# Patient Record
Sex: Female | Born: 1970 | Race: Black or African American | Hispanic: No | Marital: Single | Smoking: Current every day smoker
Health system: Southern US, Community
[De-identification: ages and names within clinical notes are randomized; demographics above are authoritative.]

## PROBLEM LIST (undated history)

## (undated) DIAGNOSIS — M329 Systemic lupus erythematosus, unspecified: Secondary | ICD-10-CM

## (undated) HISTORY — PX: TUBAL LIGATION: SHX77

---

## 1999-10-04 ENCOUNTER — Encounter: Payer: Self-pay | Admitting: *Deleted

## 1999-10-04 ENCOUNTER — Inpatient Hospital Stay (HOSPITAL_COMMUNITY): Admission: AD | Admit: 1999-10-04 | Discharge: 1999-10-04 | Payer: Self-pay | Admitting: *Deleted

## 2000-01-03 ENCOUNTER — Encounter: Payer: Self-pay | Admitting: Obstetrics

## 2000-01-03 ENCOUNTER — Encounter (INDEPENDENT_AMBULATORY_CARE_PROVIDER_SITE_OTHER): Payer: Self-pay | Admitting: Specialist

## 2000-01-03 ENCOUNTER — Inpatient Hospital Stay (HOSPITAL_COMMUNITY): Admission: AD | Admit: 2000-01-03 | Discharge: 2000-01-06 | Payer: Self-pay | Admitting: *Deleted

## 2008-11-21 ENCOUNTER — Emergency Department (HOSPITAL_COMMUNITY): Admission: EM | Admit: 2008-11-21 | Discharge: 2008-11-21 | Payer: Self-pay | Admitting: Emergency Medicine

## 2009-09-08 ENCOUNTER — Emergency Department (HOSPITAL_COMMUNITY): Admission: EM | Admit: 2009-09-08 | Discharge: 2009-09-08 | Payer: Self-pay | Admitting: Emergency Medicine

## 2010-06-29 ENCOUNTER — Emergency Department (HOSPITAL_COMMUNITY): Admission: EM | Admit: 2010-06-29 | Discharge: 2010-06-29 | Payer: Self-pay | Admitting: Emergency Medicine

## 2010-10-15 ENCOUNTER — Emergency Department (HOSPITAL_COMMUNITY)
Admission: EM | Admit: 2010-10-15 | Discharge: 2010-10-16 | Disposition: A | Discharge status: Home / Self Care | Payer: Medicaid Other | Attending: Emergency Medicine | Admitting: Emergency Medicine

## 2010-10-15 DIAGNOSIS — R05 Cough: Secondary | ICD-10-CM | POA: Insufficient documentation

## 2010-10-15 DIAGNOSIS — R071 Chest pain on breathing: Secondary | ICD-10-CM | POA: Insufficient documentation

## 2010-10-15 DIAGNOSIS — R059 Cough, unspecified: Secondary | ICD-10-CM | POA: Insufficient documentation

## 2010-10-16 ENCOUNTER — Emergency Department (HOSPITAL_COMMUNITY): Payer: Medicaid Other

## 2010-10-16 ENCOUNTER — Ambulatory Visit (HOSPITAL_COMMUNITY): Admission: RE | Admit: 2010-10-16 | Payer: Medicaid Other | Source: Ambulatory Visit

## 2010-10-18 LAB — DIFFERENTIAL
Basophils Absolute: 0 10*3/uL (ref 0.0–0.1)
Basophils Relative: 0 % (ref 0–1)
Eosinophils Relative: 1 % (ref 0–5)
Monocytes Absolute: 0.4 10*3/uL (ref 0.1–1.0)

## 2010-10-18 LAB — CBC
HCT: 35.4 % — ABNORMAL LOW (ref 36.0–46.0)
MCH: 31.2 pg (ref 26.0–34.0)
MCHC: 32.8 g/dL (ref 30.0–36.0)
MCV: 95.2 fL (ref 78.0–100.0)
RDW: 11.9 % (ref 11.5–15.5)

## 2010-10-18 LAB — POCT I-STAT, CHEM 8
Calcium, Ion: 1.16 mmol/L (ref 1.12–1.32)
HCT: 39 % (ref 36.0–46.0)
TCO2: 26 mmol/L (ref 0–100)

## 2010-11-14 ENCOUNTER — Inpatient Hospital Stay (INDEPENDENT_AMBULATORY_CARE_PROVIDER_SITE_OTHER)
Admission: RE | Admit: 2010-11-14 | Discharge: 2010-11-14 | Disposition: A | Discharge status: Home / Self Care | Payer: Medicaid Other | Source: Ambulatory Visit | Attending: Family Medicine | Admitting: Family Medicine

## 2010-11-14 DIAGNOSIS — J069 Acute upper respiratory infection, unspecified: Secondary | ICD-10-CM

## 2010-11-14 DIAGNOSIS — B9789 Other viral agents as the cause of diseases classified elsewhere: Secondary | ICD-10-CM

## 2010-12-23 NOTE — Op Note (Signed)
[facility]  Patient:    Stephanie Fitzgerald, Stephanie Fitzgerald                     MRN: [id number] Proc. Date: 01/05/00 Adm. Date:  [id number] Disc. Date: [id number] Attending:  Tammi Sou Dictator:   Andrey Spearman, M.D.                           Operative Report  PREOPERATIVE DIAGNOSIS:  POSTOPERATIVE DIAGNOSIS:  OPERATION:                    Bilateral tubal ligation.  SURGEON:                      Conni Elliot, M.D.  ASSISTANT:                    Andrey Spearman, M.D.  ANESTHESIA:  ESTIMATED BLOOD LOSS:  DESCRIPTION OF PROCEDURE:     The patients identity was confirmed and consent verified.  The patient underwent spinal anesthesia.  The patient was prepped and draped in the usual sterile fashion.  Periumbilical incision was made and fascial planes were identified.  The fascia was entered using Metzenbaum scissors.  The  patient was placed in Trendelenburg.  Right fallopian tube was identified and grasped.  Fallopian tube was followed through to the fimbria.  The fallopian tube grasped.  The fallopian tube was tied with suture securely x 2.  Approximately .5 cm segment of tube was removed.  The tube was monitored for good hemostasis which was present and released back into the peritoneum with continued good hemostasis. The same procedure was followed on the left.  The peritoneum was closed.  The fascia was closed.  Subcutaneous and cutaneous suturing was done.  The patient ad good cosmetic result at the end.  Estimated blood loss was less than 10 cc. There were no complications. DD:  01/05/00 TD:  01/09/00 Job: [id number]

## 2011-04-20 ENCOUNTER — Other Ambulatory Visit: Payer: Self-pay | Admitting: Internal Medicine

## 2011-04-20 DIAGNOSIS — Z1231 Encounter for screening mammogram for malignant neoplasm of breast: Secondary | ICD-10-CM

## 2011-04-25 ENCOUNTER — Ambulatory Visit: Payer: Medicaid Other

## 2011-12-23 ENCOUNTER — Emergency Department (INDEPENDENT_AMBULATORY_CARE_PROVIDER_SITE_OTHER)
Admission: EM | Admit: 2011-12-23 | Discharge: 2011-12-23 | Disposition: A | Discharge status: Home / Self Care | Payer: Medicaid Other | Source: Home / Self Care | Attending: Emergency Medicine | Admitting: Emergency Medicine

## 2011-12-23 ENCOUNTER — Encounter (HOSPITAL_COMMUNITY): Payer: Self-pay | Admitting: Emergency Medicine

## 2011-12-23 DIAGNOSIS — M79609 Pain in unspecified limb: Secondary | ICD-10-CM

## 2011-12-23 DIAGNOSIS — T6391XA Toxic effect of contact with unspecified venomous animal, accidental (unintentional), initial encounter: Secondary | ICD-10-CM

## 2011-12-23 DIAGNOSIS — M79646 Pain in unspecified finger(s): Secondary | ICD-10-CM

## 2011-12-23 DIAGNOSIS — T63481A Toxic effect of venom of other arthropod, accidental (unintentional), initial encounter: Secondary | ICD-10-CM

## 2011-12-23 MED ORDER — PREDNISONE 20 MG PO TABS
40.0000 mg | ORAL_TABLET | Freq: Once | ORAL | Status: AC
  Administered 2011-12-23: 40 mg via ORAL

## 2011-12-23 MED ORDER — DIPHENHYDRAMINE HCL 25 MG PO TABS: 25.0000 mg | ORAL_TABLET | Freq: Three times a day (TID) | ORAL | Status: DC | PRN

## 2011-12-23 MED ORDER — IBUPROFEN 600 MG PO TABS: 600.0000 mg | ORAL_TABLET | Freq: Four times a day (QID) | ORAL | Status: AC | PRN

## 2011-12-23 MED ORDER — PREDNISONE 20 MG PO TABS: 40.0000 mg | ORAL_TABLET | Freq: Every day | ORAL | Status: AC

## 2011-12-23 MED ORDER — IBUPROFEN 800 MG PO TABS
ORAL_TABLET | ORAL | Status: AC
  Filled 2011-12-23: qty 1

## 2011-12-23 MED ORDER — IBUPROFEN 800 MG PO TABS
800.0000 mg | ORAL_TABLET | Freq: Once | ORAL | Status: AC
  Administered 2011-12-23: 800 mg via ORAL

## 2011-12-23 MED ORDER — PREDNISONE 20 MG PO TABS
ORAL_TABLET | ORAL | Status: AC
  Filled 2011-12-23: qty 2

## 2011-12-23 NOTE — ED Notes (Signed)
Reports sting in left ring finger.  Patient did not see anything, but felt sharp pain and swelling to left ring finger, itching hand.

## 2011-12-23 NOTE — ED Provider Notes (Signed)
History     CSN: [id number]  Arrival date & time 12/23/11  1840   First MD Initiated Contact with Patient 12/23/11 1843      Chief Complaint  Patient presents with  . Insect Bite    (Consider location/radiation/quality/duration/timing/severity/associated sxs/prior treatment) HPI Comments: Patient reports that less than an hour ago she was completely well in told she while opening her car door she felt something stung her on her left ring finger she started experiencing immediate burning and measure on her finger and shooting pains down her left finger. In the next few minutes her finger started to become swollen and tender and at this point is having problems moving her finger. She immediately removed some rings she had on her finger. Patient denies any other symptoms such as sore throat, generalized rashes, facial swelling or tongue swelling or respiratory problems. She is feeling a constant throbbing burning type pain.  "It's was trying to see if I stinger", but did not see anything and started pouring alcohol on my finger.  Patient is a 41 y.o. female presenting with hand pain. The history is provided by the patient.  Hand Pain This is a new problem. The current episode started less than 1 hour ago. The problem occurs constantly. The problem has been gradually worsening. Exacerbated by: MOVEMENT AND TOUCH. The symptoms are relieved by nothing.    History reviewed. No pertinent past medical history.  History reviewed. No pertinent past surgical history.  No family history on file.  History  Substance Use Topics  . Smoking status: Current Everyday Smoker  . Smokeless tobacco: Not on file  . Alcohol Use: No    OB History    Grav Para Term Preterm Abortions TAB SAB Ect Mult Living                  Review of Systems  Constitutional: Negative for fever, chills, activity change and appetite change.  Skin: Negative for color change and wound.  Neurological: Negative for  dizziness, weakness and numbness.    Allergies  Review of patient's allergies indicates no known allergies.  Home Medications  No current outpatient prescriptions on file.  BP 112/52  Pulse 62  Temp(Src) 98.8 F (37.1 C) (Oral)  Resp 12  SpO2 100%  LMP 12/16/2011  Physical Exam  Nursing note and vitals reviewed. Constitutional: She appears well-developed and well-nourished.  Musculoskeletal: She exhibits edema and tenderness.       Left hand: She exhibits tenderness and swelling. She exhibits normal two-point discrimination and no laceration. normal sensation noted.       Hands: Skin: No rash noted. No erythema.    ED Course  Procedures (including critical care time)  Labs Reviewed - No data to display No results found.   No diagnosis found.    MDM   Patient was sudden abrupt sensation of a insect bite or sting, with subsequent localized inflammation or reaction to her left ring finger. Patient without any other systemic symptoms. Have encouraged her to keep her hand elevated apply ice and start her on antihistamines and a short course of prednisone. Patient was advised as well as to persist or worsen she should go to the emergency department. This point suspect this is a localized reaction I was unable to visualize any openings in her epidermis and did not observe signs of infection. This clinical manifestation appears suddenly in the course of less then 55 minutes before presentation to urgent care.  Jimmie Molly, MD 12/23/11 1901

## 2011-12-23 NOTE — Discharge Instructions (Signed)
Keep your hand elevated as discussed in today apply ice packs for 10-15 minutes at a time to prevent further swelling keep your hand elevated as much as possible the next 24 hours. Take Benadryl every 8 hours and continue with prednisone at home for 4 days. If swelling worsens, or any fevers or chills no improvement 24 hours should followup with the emergency department

## 2012-04-14 ENCOUNTER — Emergency Department (HOSPITAL_COMMUNITY)
Admission: EM | Admit: 2012-04-14 | Discharge: 2012-04-14 | Disposition: A | Discharge status: Home / Self Care | Payer: Medicaid Other | Source: Home / Self Care

## 2012-04-14 ENCOUNTER — Emergency Department (INDEPENDENT_AMBULATORY_CARE_PROVIDER_SITE_OTHER): Payer: Medicaid Other

## 2012-04-14 ENCOUNTER — Encounter (HOSPITAL_COMMUNITY): Payer: Self-pay

## 2012-04-14 DIAGNOSIS — S93409A Sprain of unspecified ligament of unspecified ankle, initial encounter: Secondary | ICD-10-CM

## 2012-04-14 DIAGNOSIS — S96919A Strain of unspecified muscle and tendon at ankle and foot level, unspecified foot, initial encounter: Secondary | ICD-10-CM

## 2012-04-14 DIAGNOSIS — S93609A Unspecified sprain of unspecified foot, initial encounter: Secondary | ICD-10-CM

## 2012-04-14 NOTE — ED Notes (Signed)
Pt twisted lt ankle on Friday and continues to have pain in lt ankle and foot that radiates up outer aspect of leg to her knee. Pt is able to ambulate.

## 2012-04-17 NOTE — ED Provider Notes (Signed)
History     CSN: [id number]  Arrival date & time 04/14/12  1426   None     Chief Complaint  Patient presents with  . Ankle Pain    (Consider location/radiation/quality/duration/timing/severity/associated sxs/prior treatment) HPI Comments: Twisted ankle and c/o of local pain. Yesterday.    History reviewed. No pertinent past medical history.  History reviewed. No pertinent past surgical history.  History reviewed. No pertinent family history.  History  Substance Use Topics  . Smoking status: Current Every Day Smoker -- 1.0 packs/day  . Smokeless tobacco: Not on file  . Alcohol Use: No    OB History    Grav Para Term Preterm Abortions TAB SAB Ect Mult Living                  Review of Systems  Constitutional: Negative.   Respiratory: Negative.   Musculoskeletal: Positive for joint swelling and gait problem.  Neurological: Negative.     Allergies  Review of patient's allergies indicates no known allergies.  Home Medications   Current Outpatient Rx  Name Route Sig Dispense Refill  . IBUPROFEN 200 MG PO TABS Oral Take 200 mg by mouth every 6 (six) hours as needed.    Marland Kitchen DIPHENHYDRAMINE HCL 25 MG PO TABS Oral Take 1 tablet (25 mg total) by mouth every 8 (eight) hours as needed for itching. 20 tablet 0    BP 115/77  Pulse 76  Temp 98.5 F (36.9 C) (Oral)  Resp 18  SpO2 97%  LMP 04/07/2012  Physical Exam  Constitutional: She is oriented to person, place, and time. She appears well-developed and well-nourished. No distress.  Neck: Normal range of motion. Neck supple.  Musculoskeletal:       Ankle pain, tenderness and mild swelling, tenderness about the entire ankle. No deformity.   Neurological: She is alert and oriented to person, place, and time. No cranial nerve deficit.    ED Course  Procedures (including critical care time)  Labs Reviewed - No data to display No results found.   1. Ankle sprain and strain   2. Foot sprain       MDM   ASO RICE OTC meds prn pain.  No results found. No results found.       Hayden Rasmussen, NP 04/17/12 [phone]

## 2012-04-19 NOTE — ED Provider Notes (Signed)
Medical screening examination/treatment/procedure(s) were performed by resident physician or non-physician practitioner and as supervising physician I was immediately available for consultation/collaboration.   Fumie Fiallo DOUGLAS MD.    Adeoluwa Silvers D Jaylen Claude, MD 04/19/12 1331 

## 2012-06-16 ENCOUNTER — Encounter (HOSPITAL_COMMUNITY): Payer: Self-pay | Admitting: Emergency Medicine

## 2012-06-16 ENCOUNTER — Emergency Department (HOSPITAL_COMMUNITY)
Admission: EM | Admit: 2012-06-16 | Discharge: 2012-06-16 | Disposition: A | Discharge status: Home / Self Care | Payer: Medicaid Other | Attending: Emergency Medicine | Admitting: Emergency Medicine

## 2012-06-16 DIAGNOSIS — F17200 Nicotine dependence, unspecified, uncomplicated: Secondary | ICD-10-CM | POA: Insufficient documentation

## 2012-06-16 DIAGNOSIS — L719 Rosacea, unspecified: Secondary | ICD-10-CM | POA: Insufficient documentation

## 2012-06-16 MED ORDER — METRONIDAZOLE 1 % EX GEL: Freq: Every day | CUTANEOUS | Status: DC

## 2012-06-16 MED ORDER — DOXYCYCLINE HYCLATE 100 MG PO CAPS: 100.0000 mg | ORAL_CAPSULE | Freq: Two times a day (BID) | ORAL | Status: DC

## 2012-06-16 NOTE — ED Provider Notes (Addendum)
History  This chart was scribed for Stephanie Sprout, MD by Stephanie Fitzgerald. This patient was seen in room TR10C/TR10C and the patient's care was started at 12:24.   CSN: [id number]  Arrival date & time 06/16/12  1201   First MD Initiated Contact with Patient 06/16/12 1224      Chief Complaint  Patient presents with  . Rash    (Consider location/radiation/quality/duration/timing/severity/associated sxs/prior treatment) The history is provided by the patient. No language interpreter was used.  Stephanie Fitzgerald is a 41 y.o. female who presents to the Emergency Department complaining of an itching facial rash for the past 5 months, worse upon waking this morning, that feels like someone is sticking needles in her. Pt denies any drainage from the area. Pt reports a mosquito bit her on the bridge of her nose this summer and she has had the rash ever since. Pt reports initially it was concentrated to one area,a round the bite, then she started putting ointment on it and it spread. Pt has been using ointment until 2-3 weeks ago, then it started getting worse. Pt is not allergic to any antibiotics. Pt has no PCP but she does have insurance.  History reviewed. No pertinent past medical history.  History reviewed. No pertinent past surgical history.  No family history on file.  History  Substance Use Topics  . Smoking status: Current Every Day Smoker -- 1.0 packs/day  . Smokeless tobacco: Not on file  . Alcohol Use: Yes    OB History    Grav Para Term Preterm Abortions TAB SAB Ect Mult Living                  Review of Systems  Constitutional: Negative for fever and chills.  HENT: Negative for neck pain.   Eyes: Negative for visual disturbance.  Respiratory: Negative for shortness of breath.   Cardiovascular: Negative for leg swelling.  Gastrointestinal: Negative for nausea and vomiting.  Musculoskeletal: Negative for back pain.  Skin: Positive for rash.  Neurological: Negative for  weakness.    Allergies  Review of patient's allergies indicates no known allergies.  Home Medications  No current outpatient prescriptions on file.  Triage Vitals: Pulse 53  Temp 98.3 F (36.8 C) (Oral)  Resp 22  Ht 5\' 8"  (1.727 m)  Wt 180 lb (81.647 kg)  BMI 27.37 kg/m2  Physical Exam  Nursing note and vitals reviewed. Constitutional: She is oriented to person, place, and time. She appears well-developed and well-nourished. No distress.  HENT:  Head: Normocephalic and atraumatic.    Eyes: EOM are normal. Pupils are equal, round, and reactive to light.  Neck: Neck supple. No tracheal deviation present.  Cardiovascular: Normal rate.   Pulmonary/Chest: Effort normal. No respiratory distress.  Abdominal: Soft. She exhibits no distension.  Musculoskeletal: Normal range of motion. She exhibits no edema.  Neurological: She is alert and oriented to person, place, and time.  Skin: Skin is warm and dry.  Psychiatric: She has a normal mood and affect.    ED Course  Procedures (including critical care time) DIAGNOSTIC STUDIES:  COORDINATION OF CARE: 12:45--I evaluated the patient and we discussed a treatment plan including antibiotics and incision and drainage to which the pt agreed.   13:14--I performed the incision and drainage procedure.  INCISION AND DRAINAGE Performed by: Stephanie Fitzgerald Consent: Verbal consent obtained. Risks and benefits: risks, benefits and alternatives were discussed Type: abscess  Body area: face  Anesthesia: local infiltration  Local anesthetic: lidocaine 1% with  epinephrine  Anesthetic total: 1 ml  Complexity: simple area was poked with a needle  Drainage: none  Drainage amount: 0  Patient tolerance: Patient tolerated the procedure well with no immediate complications.    Site anesthetized, incision made over site, wound drained and explored loculations, rinsed with copious amounts of normal saline, wound packed with sterile  gauze, covered with dry, sterile dressing.  Pt tolerated procedure well without complications.  Instructions for care discussed verbally and pt provided with additional written instructions for homecare and f/u.  I gave the pt instructions to follow up with a dermatologist.   Labs Reviewed - No data to display No results found.   1. Rosacea       MDM   Patient with a rash on her face that has been ongoing for several months. She states it worsened over the last 3 weeks with redness and swelling. Concerning for rosacea versus abscess. Area was anesthetized and a needle used with almost no pus drainage. Discussed with patient that feels is most likely rosacea. Will start patient on an antibiotic and referral to dermatology.     I personally performed the services described in this documentation, which was scribed in my presence.  The recorded information has been reviewed and considered.    Stephanie Sprout, MD 06/16/12 1325  Stephanie Sprout, MD 06/16/12 1329

## 2012-06-16 NOTE — ED Notes (Signed)
Pt. Reports that something bit her on the rt. Bridge of her nose this summer and she has developed a rash.

## 2012-09-27 ENCOUNTER — Encounter (HOSPITAL_COMMUNITY): Payer: Self-pay | Admitting: Emergency Medicine

## 2012-09-27 ENCOUNTER — Emergency Department (HOSPITAL_COMMUNITY)
Admission: EM | Admit: 2012-09-27 | Discharge: 2012-09-27 | Disposition: A | Discharge status: Home / Self Care | Payer: Medicaid Other | Source: Home / Self Care | Attending: Emergency Medicine | Admitting: Emergency Medicine

## 2012-09-27 DIAGNOSIS — R21 Rash and other nonspecific skin eruption: Secondary | ICD-10-CM

## 2012-09-27 LAB — CBC
HCT: 36.2 % (ref 36.0–46.0)
MCH: 31.5 pg (ref 26.0–34.0)
MCHC: 33.4 g/dL (ref 30.0–36.0)
RDW: 12.2 % (ref 11.5–15.5)

## 2012-09-27 LAB — COMPREHENSIVE METABOLIC PANEL
ALT: 14 U/L (ref 0–35)
Calcium: 9.8 mg/dL (ref 8.4–10.5)
Creatinine, Ser: 0.7 mg/dL (ref 0.50–1.10)
GFR calc Af Amer: >90 mL/min (ref >90)
Glucose, Bld: 80 mg/dL (ref 70–99)
Sodium: 138 mEq/L (ref 135–145)
Total Protein: 8.1 g/dL (ref 6.0–8.3)

## 2012-09-27 MED ORDER — TRIAMCINOLONE ACETONIDE 0.1 % EX CREA: TOPICAL_CREAM | Freq: Two times a day (BID) | CUTANEOUS | Status: AC

## 2012-09-27 MED ORDER — DOXYCYCLINE HYCLATE 100 MG PO CAPS: 100.0000 mg | ORAL_CAPSULE | Freq: Two times a day (BID) | ORAL | Status: AC

## 2012-09-27 NOTE — ED Notes (Signed)
Pt c/o rash on face x2 days Sx include: swelling, itching, pain Denies: f/v/n/d, new hygiene products, foods, SOB Hast tried OTC ointment First time this has happened to her  She is alert w/no signs of acute distress

## 2012-09-27 NOTE — ED Provider Notes (Signed)
History     CSN: [id number]  Arrival date & time 09/27/12  1030   First MD Initiated Contact with Patient 09/27/12 1038      Chief Complaint  Patient presents with  . Rash    (Consider location/radiation/quality/duration/timing/severity/associated sxs/prior treatment) HPI Comments: Patient presents urgent care describing that for the last week or so she has developed this worsening rash on her face. It started on the right side of her cheek and now has moved to oversew left side and top of her nose. The rash is tender it does itch some and now its swollen. She describes it in the month of November she had a similar rash and was treated in the emergency department with antibiotics which she described rash improved afterwards. She however describes it she always had a bit of a rash in the same area that it never cleared up completely. She suspects it last week and she was exposed to cold weather, the rash returned and got much worse.  On further questioning patient denies any systemic symptoms such as arthralgias, weight loss, fevers, appetite changes, or oral mucosal lesions.  Patient is a 42 y.o. female presenting with rash. The history is provided by the patient.  Rash Location:  Face Quality: painful, redness, scaling and swelling   Quality: not bruising, not burning, not dry and not weeping   Pain details:    Quality:  Aching and pressure   Onset quality:  Gradual   Duration:  1 day Severity:  Moderate Onset quality:  Gradual Context: not chemical exposure, not exposure to similar rash, not hot tub use, not insect bite/sting, not plant contact and not sun exposure   Relieved by: Other locations have been treated with antibiotics patient reports improvement. Worsened by:  Nothing tried Associated symptoms: no diarrhea, no fatigue, no fever, no headaches, no joint pain, no myalgias, no nausea, no periorbital edema, no shortness of breath and not vomiting     History reviewed. No  pertinent past medical history.  Past Surgical History  Procedure Laterality Date  . Tubal ligation      History reviewed. No pertinent family history.  History  Substance Use Topics  . Smoking status: Current Every Day Smoker -- 0.50 packs/day    Types: Cigarettes  . Smokeless tobacco: Not on file  . Alcohol Use: Yes    OB History   Grav Para Term Preterm Abortions TAB SAB Ect Mult Living                  Review of Systems  Constitutional: Negative for fever, chills, diaphoresis, activity change, appetite change and fatigue.  HENT: Negative for neck pain.   Respiratory: Negative for shortness of breath.   Gastrointestinal: Negative for nausea, vomiting and diarrhea.  Musculoskeletal: Negative for myalgias, back pain, joint swelling, arthralgias and gait problem.  Skin: Positive for color change and rash.  Neurological: Negative for weakness and headaches.    Allergies  Review of patient's allergies indicates no known allergies.  Home Medications   Current Outpatient Rx  Name  Route  Sig  Dispense  Refill  . doxycycline (VIBRAMYCIN) 100 MG capsule   Oral   Take 1 capsule (100 mg total) by mouth 2 (two) times daily.   28 capsule   0   . doxycycline (VIBRAMYCIN) 100 MG capsule   Oral   Take 1 capsule (100 mg total) by mouth 2 (two) times daily.   20 capsule   0   .  metroNIDAZOLE (METROGEL) 1 % gel   Topical   Apply topically daily. Apply to affected area on the face   45 g   0   . triamcinolone cream (KENALOG) 0.1 %   Topical   Apply topically 2 (two) times daily. Apply bid x 2 weeks   30 g   0     BP 126/86  Pulse 57  Temp(Src) 98.4 F (36.9 C) (Oral)  Resp 18  SpO2 100%  LMP 09/07/2012  Physical Exam  Nursing note and vitals reviewed. Constitutional: She is oriented to person, place, and time. Vital signs are normal. She appears well-developed and well-nourished.  HENT:  Head: Normocephalic.    Eyes: Conjunctivae are normal. Pupils are  equal, round, and reactive to light. Right eye exhibits no discharge. Left eye exhibits no discharge. No scleral icterus.  Neck: Neck supple. No JVD present.  Cardiovascular: Normal rate.   No murmur heard. Pulmonary/Chest: Effort normal and breath sounds normal.  Lymphadenopathy:    She has no cervical adenopathy.  Neurological: She is alert and oriented to person, place, and time.  Skin: Rash noted. There is erythema.    ED Course  Procedures (including critical care time)  Labs Reviewed  ANA  CBC  SEDIMENTATION RATE  COMPREHENSIVE METABOLIC PANEL   No results found.   1. Facial rash     Patient Stephanie Fitzgerald consented- for picture for medical documentation purposes.  MDM   Problem #1 recurrent facial " butterfly pattern"- will pursue further evaluation, suspicious as well for a temperature/ environmental induce rash as patient reports started/exacerbated with the cold weather.  Review of systems, somewhat unremarkable as patient denied any arthralgia or systemic symptoms. Does describe having had this rash on several occasions although smaller. Today we have ordered a screening for autoimmune disorders suspecting perhaps a dermatological manifestation of lupus. Patient had been prescribed empirical antibiotic for 7 days and she describes that she has responded to this treatment in the past and also a topical medium potency steroid cream.   Patient has a primary care Dr. will follow for medical as an active Medicaid card- have instructed patient to call today his primary care doctor's office to establish a followup visit in 2-3 weeks. Patient agrees understands and will proceed to schedule a followup visit with his primary care doctor. She has been advised she will be contacted if any of her test results come back abnormal     Jimmie Molly, MD 09/27/12 1221

## 2012-09-30 LAB — ANA: Anti Nuclear Antibody(ANA): NEGATIVE

## 2013-05-14 ENCOUNTER — Other Ambulatory Visit: Payer: Self-pay

## 2013-05-14 DIAGNOSIS — Z1231 Encounter for screening mammogram for malignant neoplasm of breast: Secondary | ICD-10-CM

## 2013-06-05 ENCOUNTER — Ambulatory Visit: Payer: Medicaid Other

## 2013-06-24 ENCOUNTER — Ambulatory Visit: Payer: Self-pay | Admitting: Advanced Practice Midwife

## 2013-12-01 ENCOUNTER — Other Ambulatory Visit: Payer: Self-pay

## 2013-12-01 DIAGNOSIS — Z1231 Encounter for screening mammogram for malignant neoplasm of breast: Secondary | ICD-10-CM

## 2013-12-02 ENCOUNTER — Ambulatory Visit
Admission: RE | Admit: 2013-12-02 | Discharge: 2013-12-02 | Disposition: A | Discharge status: Home / Self Care | Payer: Medicaid Other | Source: Ambulatory Visit

## 2013-12-02 ENCOUNTER — Encounter (INDEPENDENT_AMBULATORY_CARE_PROVIDER_SITE_OTHER): Payer: Self-pay

## 2013-12-02 DIAGNOSIS — Z1231 Encounter for screening mammogram for malignant neoplasm of breast: Secondary | ICD-10-CM

## 2014-09-11 ENCOUNTER — Emergency Department (HOSPITAL_COMMUNITY)
Admission: EM | Admit: 2014-09-11 | Discharge: 2014-09-11 | Disposition: A | Discharge status: Home / Self Care | Payer: Medicaid Other | Source: Home / Self Care | Attending: Family Medicine | Admitting: Family Medicine

## 2014-09-11 ENCOUNTER — Encounter (HOSPITAL_COMMUNITY): Payer: Self-pay | Admitting: Emergency Medicine

## 2014-09-11 DIAGNOSIS — M5489 Other dorsalgia: Secondary | ICD-10-CM

## 2014-09-11 LAB — POCT URINALYSIS DIP (DEVICE)
Bilirubin Urine: NEGATIVE
GLUCOSE, UA: NEGATIVE mg/dL
Hgb urine dipstick: NEGATIVE
Ketones, ur: NEGATIVE mg/dL
Leukocytes, UA: NEGATIVE
NITRITE: NEGATIVE
PROTEIN: NEGATIVE mg/dL
Specific Gravity, Urine: 1.01 (ref 1.005–1.030)
Urobilinogen, UA: 0.2 mg/dL (ref 0.0–1.0)
pH: 7 (ref 5.0–8.0)

## 2014-09-11 LAB — POCT PREGNANCY, URINE: Preg Test, Ur: NEGATIVE

## 2014-09-11 MED ORDER — CYCLOBENZAPRINE HCL 10 MG PO TABS: 10.0000 mg | ORAL_TABLET | Freq: Two times a day (BID) | ORAL | Status: DC | PRN

## 2014-09-11 MED ORDER — DICLOFENAC SODIUM 75 MG PO TBEC: 75.0000 mg | DELAYED_RELEASE_TABLET | Freq: Two times a day (BID) | ORAL | Status: DC

## 2014-09-11 NOTE — ED Notes (Signed)
C/o back pain onset last night; denies inj/trauma  But works as a Glass blower/designerpacker in [employer] and feels like she pulled a muscle  Denies urinary sx  Alert, no signs of acute distress.

## 2014-09-11 NOTE — ED Provider Notes (Signed)
CSN: [id number]     Arrival date & time 09/11/14  1436 History   First MD Initiated Contact with Patient 09/11/14 1515     Chief Complaint  Patient presents with  . Back Pain   (Consider location/radiation/quality/duration/timing/severity/associated sxs/prior Treatment) HPI  44 year old female presents for evaluation of back pain. This started yesterday. She has pain over her entire back in the muscles. No midline pain. No extremity numbness or weakness. No loss of bowel or bladder control. She took ibuprofen last night which helped.  History reviewed. No pertinent past medical history. Past Surgical History  Procedure Laterality Date  . Tubal ligation     No family history on file. History  Substance Use Topics  . Smoking status: Current Every Day Smoker -- 0.50 packs/day    Types: Cigarettes  . Smokeless tobacco: Not on file  . Alcohol Use: Yes   OB History    No data available     Review of Systems  Musculoskeletal: Positive for back pain.  All other systems reviewed and are negative.   Allergies  Review of patient's allergies indicates no known allergies.  Home Medications   Prior to Admission medications   Medication Sig Start Date End Date Taking? Authorizing Provider  mycophenolate (CELLCEPT) 500 MG tablet Take 500 mg by mouth 2 (two) times daily.   Yes Historical Provider, MD  predniSONE (DELTASONE) 20 MG tablet Take 20 mg by mouth daily with breakfast.   Yes Historical Provider, MD  cyclobenzaprine (FLEXERIL) 10 MG tablet Take 1 tablet (10 mg total) by mouth 2 (two) times daily as needed for muscle spasms. 09/11/14   Graylon Good, PA-C  diclofenac (VOLTAREN) 75 MG EC tablet Take 1 tablet (75 mg total) by mouth 2 (two) times daily. 09/11/14   Graylon Good, PA-C  doxycycline (VIBRAMYCIN) 100 MG capsule Take 1 capsule (100 mg total) by mouth 2 (two) times daily. 06/16/12   [name], MD  metroNIDAZOLE (METROGEL) 1 % gel Apply topically daily. Apply to  affected area on the face 06/16/12   [name], MD   BP 121/85 mmHg  Pulse 66  Temp(Src) 98.2 F (36.8 C) (Oral)  Resp 16  SpO2 98%  LMP 09/06/2013 Physical Exam  Constitutional: She is oriented to person, place, and time. Vital signs are normal. She appears well-developed and well-nourished. No distress.  HENT:  Head: Normocephalic and atraumatic.  Cardiovascular: Normal rate, regular rhythm and normal heart sounds.   Pulmonary/Chest: Effort normal and breath sounds normal. No respiratory distress.  Abdominal: Soft. Normal appearance. She exhibits no mass. There is no tenderness.  Musculoskeletal:       Thoracic back: Normal.       Lumbar back: Normal.  Neurological: She is alert and oriented to person, place, and time. She has normal strength and normal reflexes. No sensory deficit. Coordination and gait normal.  Skin: Skin is warm and dry. No rash noted. She is not diaphoretic.  Psychiatric: She has a normal mood and affect. Judgment normal.  Nursing note and vitals reviewed.   ED Course  Procedures (including critical care time) Labs Review Labs Reviewed  POCT URINALYSIS DIP (DEVICE)  POCT PREGNANCY, URINE    Imaging Review No results found.   MDM   1. Other back pain    Treat with NSAID and flexeril. Stay active.  F/U PRN    New Prescriptions   CYCLOBENZAPRINE (FLEXERIL) 10 MG TABLET    Take 1 tablet (10 mg total) by mouth 2 (  two) times daily as needed for muscle spasms.   DICLOFENAC (VOLTAREN) 75 MG EC TABLET    Take 1 tablet (75 mg total) by mouth 2 (two) times daily.       Graylon GoodZachary H Delray Reza, PA-C 09/11/14 1537

## 2014-09-11 NOTE — Discharge Instructions (Signed)
Back Pain, Adult °Back pain is very common. The pain often gets better over time. The cause of back pain is usually not dangerous. Most people can learn to manage their back pain on their own.  °HOME CARE  °· Stay active. Start with short walks on flat ground if you can. Try to walk farther each day. °· Do not sit, drive, or stand in one place for more than 30 minutes. Do not stay in bed. °· Do not avoid exercise or work. Activity can help your back heal faster. °· Be careful when you bend or lift an object. Bend at your knees, keep the object close to you, and do not twist. °· Sleep on a firm mattress. Lie on your side, and bend your knees. If you lie on your back, put a pillow under your knees. °· Only take medicines as told by your doctor. °· Put ice on the injured area. °¨ Put ice in a plastic bag. °¨ Place a towel between your skin and the bag. °¨ Leave the ice on for 15-20 minutes, 03-04 times a day for the first 2 to 3 days. After that, you can switch between ice and heat packs. °· Ask your doctor about back exercises or massage. °· Avoid feeling anxious or stressed. Find good ways to deal with stress, such as exercise. °GET HELP RIGHT AWAY IF:  °· Your pain does not go away with rest or medicine. °· Your pain does not go away in 1 week. °· You have new problems. °· You do not feel well. °· The pain spreads into your legs. °· You cannot control when you poop (bowel movement) or pee (urinate). °· Your arms or legs feel weak or lose feeling (numbness). °· You feel sick to your stomach (nauseous) or throw up (vomit). °· You have belly (abdominal) pain. °· You feel like you may pass out (faint). °MAKE SURE YOU:  °· Understand these instructions. °· Will watch your condition. °· Will get help right away if you are not doing well or get worse. °Document Released: 01/10/2008 Document Revised: 10/16/2011 Document Reviewed: 11/25/2013 °[employer]® Patient Information ©2015 [employer]. This information is not intended  to replace advice given to you by your health care provider. Make sure you discuss any questions you have with your health care provider. ° °

## 2014-10-22 ENCOUNTER — Other Ambulatory Visit: Payer: Self-pay

## 2014-10-22 DIAGNOSIS — Z1231 Encounter for screening mammogram for malignant neoplasm of breast: Secondary | ICD-10-CM

## 2014-12-04 ENCOUNTER — Ambulatory Visit: Payer: Medicaid Other

## 2015-11-06 ENCOUNTER — Encounter (HOSPITAL_COMMUNITY): Payer: Self-pay | Admitting: Emergency Medicine

## 2015-11-06 ENCOUNTER — Emergency Department (INDEPENDENT_AMBULATORY_CARE_PROVIDER_SITE_OTHER)
Admission: EM | Admit: 2015-11-06 | Discharge: 2015-11-06 | Disposition: A | Discharge status: Home / Self Care | Payer: Self-pay | Source: Home / Self Care | Attending: Emergency Medicine | Admitting: Emergency Medicine

## 2015-11-06 DIAGNOSIS — K529 Noninfective gastroenteritis and colitis, unspecified: Secondary | ICD-10-CM

## 2015-11-06 MED ORDER — ONDANSETRON HCL 4 MG PO TABS: 4.0000 mg | ORAL_TABLET | Freq: Three times a day (TID) | ORAL | Status: DC | PRN

## 2015-11-06 MED ORDER — ONDANSETRON 4 MG PO TBDP
4.0000 mg | ORAL_TABLET | Freq: Once | ORAL | Status: AC
  Administered 2015-11-06: 4 mg via ORAL

## 2015-11-06 MED ORDER — ONDANSETRON 4 MG PO TBDP
ORAL_TABLET | ORAL | Status: AC
  Filled 2015-11-06: qty 1

## 2015-11-06 NOTE — ED Notes (Signed)
C/o cold sx onset today associated w/cough, chills, v/d and HA A&O x4... No acute distress.

## 2015-11-06 NOTE — Discharge Instructions (Signed)
You have a stomach bug. The worst is typically over in 24-48 hours. Take Zofran every 8 hours as needed for nausea and vomiting. Make sure you are drinking plenty of liquids. Once you start to feel better, slowly reintroduce solid food. The diarrhea should start to improve on Monday, but may take up to a week to fully resolve. Follow-up as needed.

## 2015-11-06 NOTE — ED Provider Notes (Signed)
CSN: [id number]     Arrival date & time 11/06/15  1842 History   First MD Initiated Contact with Patient 11/06/15 2035     Chief Complaint  Patient presents with  . URI   (Consider location/radiation/quality/duration/timing/severity/associated sxs/prior Treatment) HPI  She is a 45 year old woman here for evaluation of vomiting. She states she developed nausea, vomiting, and diarrhea today. This is associated with sweats and chills, but no documented fever. She denies any nasal congestion or rhinorrhea. No sore throat. She does report a minimal cough. She has been able to tolerate small sips of ginger ale, but that's it. She denies any abdominal pain.  History reviewed. No pertinent past medical history. Past Surgical History  Procedure Laterality Date  . Tubal ligation     No family history on file. Social History  Substance Use Topics  . Smoking status: Current Every Day Smoker -- 0.50 packs/day    Types: Cigarettes  . Smokeless tobacco: None  . Alcohol Use: Yes   OB History    No data available     Review of Systems As in history of present illness Allergies  Review of patient's allergies indicates no known allergies.  Home Medications   Prior to Admission medications   Medication Sig Start Date End Date Taking? Authorizing Provider  predniSONE (DELTASONE) 20 MG tablet Take 20 mg by mouth daily with breakfast.   Yes Historical Provider, MD  cyclobenzaprine (FLEXERIL) 10 MG tablet Take 1 tablet (10 mg total) by mouth 2 (two) times daily as needed for muscle spasms. 09/11/14   Graylon GoodZachary H Baker, PA-C  diclofenac (VOLTAREN) 75 MG EC tablet Take 1 tablet (75 mg total) by mouth 2 (two) times daily. 09/11/14   Graylon GoodZachary H Baker, PA-C  mycophenolate (CELLCEPT) 500 MG tablet Take 500 mg by mouth 2 (two) times daily.    Historical Provider, MD  ondansetron (ZOFRAN) 4 MG tablet Take 1 tablet (4 mg total) by mouth every 8 (eight) hours as needed for nausea or vomiting. 11/06/15   Charm RingsErin J Honig,  MD   Meds Ordered and Administered this Visit   Medications  ondansetron (ZOFRAN-ODT) disintegrating tablet 4 mg (4 mg Oral Given 11/06/15 2107)    BP 135/92 mmHg  Pulse 66  Temp(Src) 99 F (37.2 C) (Oral)  Resp 20  SpO2 99%  LMP 11/06/2015 No data found.   Physical Exam  Constitutional: She is oriented to person, place, and time. She appears well-nourished. No distress.  Neck: Neck supple.  Cardiovascular: Normal rate, regular rhythm and normal heart sounds.   No murmur heard. Pulmonary/Chest: Effort normal and breath sounds normal. No respiratory distress. She has no wheezes. She has no rales.  Abdominal: Soft. Bowel sounds are normal. She exhibits no distension. There is no tenderness. There is no rebound and no guarding.  Neurological: She is alert and oriented to person, place, and time.    ED Course  Procedures (including critical care time)  Labs Review Labs Reviewed - No data to display  Imaging Review No results found.    MDM   1. Gastroenteritis    Nausea is improved after Zofran.  Symptomatic treatment with Zofran for nausea and vomiting. Discussed expected time course. Follow-up as needed.    Charm RingsErin J Honig, MD 11/06/15 2129

## 2016-07-19 ENCOUNTER — Emergency Department (HOSPITAL_COMMUNITY)
Admission: EM | Admit: 2016-07-19 | Discharge: 2016-07-19 | Disposition: A | Discharge status: Home / Self Care | Payer: Medicaid Other | Attending: Emergency Medicine | Admitting: Emergency Medicine

## 2016-07-19 ENCOUNTER — Encounter (HOSPITAL_COMMUNITY): Payer: Self-pay | Admitting: *Deleted

## 2016-07-19 DIAGNOSIS — F17210 Nicotine dependence, cigarettes, uncomplicated: Secondary | ICD-10-CM | POA: Insufficient documentation

## 2016-07-19 DIAGNOSIS — M546 Pain in thoracic spine: Secondary | ICD-10-CM | POA: Diagnosis present

## 2016-07-19 DIAGNOSIS — X501XXA Overexertion from prolonged static or awkward postures, initial encounter: Secondary | ICD-10-CM | POA: Insufficient documentation

## 2016-07-19 DIAGNOSIS — Z79899 Other long term (current) drug therapy: Secondary | ICD-10-CM | POA: Insufficient documentation

## 2016-07-19 DIAGNOSIS — Y939 Activity, unspecified: Secondary | ICD-10-CM | POA: Insufficient documentation

## 2016-07-19 DIAGNOSIS — Y929 Unspecified place or not applicable: Secondary | ICD-10-CM | POA: Diagnosis not present

## 2016-07-19 DIAGNOSIS — M62830 Muscle spasm of back: Secondary | ICD-10-CM | POA: Insufficient documentation

## 2016-07-19 DIAGNOSIS — Y999 Unspecified external cause status: Secondary | ICD-10-CM | POA: Diagnosis not present

## 2016-07-19 MED ORDER — CYCLOBENZAPRINE HCL 10 MG PO TABS: 10.0000 mg | ORAL_TABLET | Freq: Two times a day (BID) | ORAL | 0 refills | Status: DC | PRN

## 2016-07-19 MED ORDER — ACETAMINOPHEN 500 MG PO TABS
1000.0000 mg | ORAL_TABLET | Freq: Once | ORAL | Status: AC
  Administered 2016-07-19: 1000 mg via ORAL
  Filled 2016-07-19: qty 2

## 2016-07-19 MED ORDER — CYCLOBENZAPRINE HCL 10 MG PO TABS
10.0000 mg | ORAL_TABLET | Freq: Once | ORAL | Status: AC
  Administered 2016-07-19: 10 mg via ORAL
  Filled 2016-07-19: qty 1

## 2016-07-19 MED ORDER — IBUPROFEN 800 MG PO TABS
800.0000 mg | ORAL_TABLET | Freq: Three times a day (TID) | ORAL | 0 refills | Status: DC
Start: 2016-07-19 — End: 2019-12-08

## 2016-07-19 NOTE — ED Triage Notes (Addendum)
Pt reports onset this am of sharp pain to left upper back while getting something out of a drawer. Pain radiates down her back.

## 2016-07-19 NOTE — ED Notes (Signed)
Pt is in stable condition upon d/c and ambulates from ED. 

## 2016-07-19 NOTE — Discharge Instructions (Signed)
Medications: Flexeril, ibuprofen  Treatment: Take Flexeril twice daily as needed for muscle pain and spasms. Do not drive or operate machinery when taking this medication. Take ibuprofen every 8 hours as needed for your pain. Use heating pad 3-4 times daily alternating 20 minutes on, 20 minutes off. Attempt the stretches that we discussed as tolerated.  Follow-up: Please return to emergency department if you develop any new or worsening symptoms and follow-up with your primary care provider if your symptoms are not improving over the next week.

## 2016-07-19 NOTE — ED Provider Notes (Signed)
[facility]-EMERGENCY DEPT Provider Note   CSN: [id number] Arrival date & time: 07/19/16  1032  By signing my name below, I, Placido Sou, attest that this documentation has been prepared under the direction and in the presence of Buel Ream, PA-C.  Electronically Signed: Placido Sou, ED Scribe. 07/19/16. 11:13 AM.   History   Chief Complaint Chief Complaint  Patient presents with  . Back Pain   HPI HPI Comments: Stephanie Fitzgerald is a 45 y.o. female who presents to the Emergency Department complaining of sudden onset, moderate, left upper back pain which began PTA. She states she was pulling something out of a drawer and felt a sudden onset of her pain. Her pain worsens with movement, especially movement of her LUE. She took 1x aleve w/o significant relief. She denies a h/o CA,  IVDA or kidney issues. Pt denies numbness, tingling, fevers, chills, leg pain, CP, SOB, abdominal pain, saddle anesthesia, bowel or bladder incontinence, nausea, vomiting, urinary symptoms or other associated symptoms at this time.   The history is provided by the patient and medical records. No language interpreter was used.    History reviewed. No pertinent past medical history.  There are no active problems to display for this patient.   Past Surgical History:  Procedure Laterality Date  . TUBAL LIGATION      OB History    No data available       Home Medications    Prior to Admission medications   Medication Sig Start Date End Date Taking? Authorizing Provider  cyclobenzaprine (FLEXERIL) 10 MG tablet Take 1 tablet (10 mg total) by mouth 2 (two) times daily as needed for muscle spasms. 07/19/16   Emi Holes, PA-C  diclofenac (VOLTAREN) 75 MG EC tablet Take 1 tablet (75 mg total) by mouth 2 (two) times daily. 09/11/14   Graylon Good, PA-C  ibuprofen (ADVIL,MOTRIN) 800 MG tablet Take 1 tablet (800 mg total) by mouth 3 (three) times daily. 07/19/16   Emi Holes, PA-C  mycophenolate  (CELLCEPT) 500 MG tablet Take 500 mg by mouth 2 (two) times daily.    Historical Provider, MD  ondansetron (ZOFRAN) 4 MG tablet Take 1 tablet (4 mg total) by mouth every 8 (eight) hours as needed for nausea or vomiting. 11/06/15   Charm Rings, MD  predniSONE (DELTASONE) 20 MG tablet Take 20 mg by mouth daily with breakfast.    Historical Provider, MD    Family History History reviewed. No pertinent family history.  Social History Social History  Substance Use Topics  . Smoking status: Current Every Day Smoker    Packs/day: 0.50    Types: Cigarettes  . Smokeless tobacco: Not on file  . Alcohol use Yes     Allergies   Patient has no known allergies.   Review of Systems Review of Systems  Constitutional: Negative for chills and fever.  HENT: Negative for facial swelling and sore throat.   Respiratory: Negative for shortness of breath.   Cardiovascular: Negative for chest pain.  Gastrointestinal: Negative for abdominal pain, nausea and vomiting.  Genitourinary: Negative for dysuria.  Musculoskeletal: Positive for back pain and myalgias. Negative for neck pain and neck stiffness.  Skin: Negative for rash and wound.  Neurological: Negative for headaches.  Psychiatric/Behavioral: The patient is not nervous/anxious.     Physical Exam Updated Vital Signs BP 125/90 (BP Location: Right Arm)   Pulse 62   Temp 97.9 F (36.6 C) (Oral)   Resp 18  LMP 07/15/2016   SpO2 100%    Physical Exam  Constitutional: She appears well-developed and well-nourished. No distress.  HENT:  Head: Normocephalic and atraumatic.  Mouth/Throat: Oropharynx is clear and moist. No oropharyngeal exudate.  Eyes: Conjunctivae are normal. Pupils are equal, round, and reactive to light. Right eye exhibits no discharge. Left eye exhibits no discharge. No scleral icterus.  Neck: Normal range of motion. Neck supple. No thyromegaly present.  Cardiovascular: Normal rate, regular rhythm and normal heart sounds.   Exam reveals no gallop and no friction rub.   No murmur heard. Pulmonary/Chest: Effort normal and breath sounds normal. No stridor. No respiratory distress. She has no wheezes. She has no rales.  Abdominal: Soft. Bowel sounds are normal. She exhibits no distension. There is no tenderness. There is no rebound and no guarding.  Musculoskeletal: She exhibits tenderness. She exhibits no edema.  TTP and spasm just inferior to the left shoulder blade. TTP to the left thoracic and lumbar paraspinal musculature. No midline spinous tenderness.   Lymphadenopathy:    She has no cervical adenopathy.  Neurological: She is alert. Coordination normal.  Skin: Skin is warm and dry. No rash noted. She is not diaphoretic. No pallor.  Psychiatric: She has a normal mood and affect.  Nursing note and vitals reviewed.   ED Treatments / Results  Labs (all labs ordered are listed, but only abnormal results are displayed) Labs Reviewed - No data to display  EKG  EKG Interpretation None       Radiology No results found.  Procedures Procedures  DIAGNOSTIC STUDIES: Oxygen Saturation is 100% on RA, normal by my interpretation.    COORDINATION OF CARE: 11:11 AM Discussed next steps with pt. Pt verbalized understanding and is agreeable with the plan.    Medications Ordered in ED Medications  cyclobenzaprine (FLEXERIL) tablet 10 mg (10 mg Oral Given 07/19/16 1118)  acetaminophen (TYLENOL) tablet 1,000 mg (1,000 mg Oral Given 07/19/16 1119)     Initial Impression / Assessment and Plan / ED Course  I have reviewed the triage vital signs and the nursing notes.  Pertinent labs & imaging results that were available during my care of the patient were reviewed by me and considered in my medical decision making (see chart for details).  Clinical Course     Patient with L thoracic back pain.  No neurological deficits and normal neuro exam.  Patient is ambulatory.  No loss of bowel or bladder control.  No  concern for cauda equina.  No fever, night sweats, weight loss, h/o cancer, IVDA, no recent procedure to back. No urinary symptoms suggestive of UTI. Patient given Flexeril and Tylenol in the ED with good relief. Discharge home with Flexeril and ibuprofen. Heat and ice therapy discussed, as well as stretches. Supportive care and return precaution discussed. Patient understands and agrees with plan. Appears safe for discharge at this time.   I personally performed the services described in this documentation, which was scribed in my presence. The recorded information has been reviewed and is accurate.  Final Clinical Impressions(s) / ED Diagnoses   Final diagnoses:  Muscle spasm of back    New Prescriptions Discharge Medication List as of 07/19/2016 11:40 AM    START taking these medications   Details  ibuprofen (ADVIL,MOTRIN) 800 MG tablet Take 1 tablet (800 mg total) by mouth 3 (three) times daily., Starting Wed 07/19/2016, Print         Emi Holeslexandra M Clayson Riling, PA-C 07/19/16 1246  Benjiman CoreNathan Pickering, MD 07/19/16 [phone]

## 2017-03-05 ENCOUNTER — Ambulatory Visit (HOSPITAL_COMMUNITY)
Admission: EM | Admit: 2017-03-05 | Discharge: 2017-03-05 | Disposition: A | Discharge status: Home / Self Care | Payer: Medicaid Other | Attending: Family Medicine | Admitting: Family Medicine

## 2017-03-05 ENCOUNTER — Encounter (HOSPITAL_COMMUNITY): Payer: Self-pay | Admitting: *Deleted

## 2017-03-05 DIAGNOSIS — L03113 Cellulitis of right upper limb: Secondary | ICD-10-CM

## 2017-03-05 DIAGNOSIS — S60561A Insect bite (nonvenomous) of right hand, initial encounter: Secondary | ICD-10-CM | POA: Diagnosis not present

## 2017-03-05 DIAGNOSIS — W57XXXA Bitten or stung by nonvenomous insect and other nonvenomous arthropods, initial encounter: Secondary | ICD-10-CM | POA: Diagnosis not present

## 2017-03-05 MED ORDER — PREDNISONE 10 MG (21) PO TBPK: ORAL_TABLET | ORAL | 0 refills | Status: DC

## 2017-03-05 MED ORDER — FAMOTIDINE 20 MG PO TABS
20.0000 mg | ORAL_TABLET | Freq: Once | ORAL | Status: AC
  Administered 2017-03-05: 20 mg via ORAL

## 2017-03-05 MED ORDER — DOXYCYCLINE HYCLATE 100 MG PO TABS
100.0000 mg | ORAL_TABLET | Freq: Once | ORAL | Status: AC
  Administered 2017-03-05: 100 mg via ORAL

## 2017-03-05 MED ORDER — METHYLPREDNISOLONE ACETATE 80 MG/ML IJ SUSP
80.0000 mg | Freq: Once | INTRAMUSCULAR | Status: AC
  Administered 2017-03-05: 80 mg via INTRAMUSCULAR

## 2017-03-05 MED ORDER — DOXYCYCLINE HYCLATE 100 MG PO TABS
ORAL_TABLET | ORAL | Status: AC
  Filled 2017-03-05: qty 1

## 2017-03-05 MED ORDER — DIPHENHYDRAMINE HCL 25 MG PO CAPS
ORAL_CAPSULE | ORAL | Status: AC
  Filled 2017-03-05: qty 2

## 2017-03-05 MED ORDER — METHYLPREDNISOLONE ACETATE 80 MG/ML IJ SUSP
INTRAMUSCULAR | Status: AC
  Filled 2017-03-05: qty 1

## 2017-03-05 MED ORDER — DOXYCYCLINE HYCLATE 100 MG PO CAPS: 100.0000 mg | ORAL_CAPSULE | Freq: Two times a day (BID) | ORAL | 0 refills | Status: DC

## 2017-03-05 MED ORDER — FAMOTIDINE 20 MG PO TABS
ORAL_TABLET | ORAL | Status: AC
  Filled 2017-03-05: qty 1

## 2017-03-05 MED ORDER — DIPHENHYDRAMINE HCL 25 MG PO CAPS
50.0000 mg | ORAL_CAPSULE | Freq: Once | ORAL | Status: AC
  Administered 2017-03-05: 50 mg via ORAL

## 2017-03-05 NOTE — ED Triage Notes (Signed)
Patient states on Friday she sustained an insect bite to the right posterior aspect of hand. States is very itchy. On exam area is red and swollen. No signs drainage noted to area of bite.

## 2017-03-05 NOTE — ED Provider Notes (Signed)
CSN: [id number]     Arrival date & time 03/05/17  1359 History   None    Chief Complaint  Patient presents with  . Insect Bite   (Consider location/radiation/quality/duration/timing/severity/associated sxs/prior Treatment) Stephanie Fitzgerald is a 46 y.o. female who [facility] with a chief complaint of swelling, redness, itchiness, turn right hand. States 2 nights ago she was bitten by some type of insect, she did not see what it was. She has applied rubbing alcohol to the area, was given her no relief. Has had no over-the-counter Benadryl, or any anti-itch cream. Denies any fever, chills, or other constitutional symptoms. She is right handed, has had some drainage from the area that was bitten.       History reviewed. No pertinent past medical history. Past Surgical History:  Procedure Laterality Date  . TUBAL LIGATION     History reviewed. No pertinent family history. Social History  Substance Use Topics  . Smoking status: Current Every Day Smoker    Packs/day: 0.50    Types: Cigarettes  . Smokeless tobacco: Never Used  . Alcohol use Yes   OB History    No data available     Review of Systems  Skin: Positive for color change and wound.  All other systems reviewed and are negative.   Allergies  Patient has no known allergies.  Home Medications   Prior to Admission medications   Medication Sig Start Date End Date Taking? Authorizing Provider  cyclobenzaprine (FLEXERIL) 10 MG tablet Take 1 tablet (10 mg total) by mouth 2 (two) times daily as needed for muscle spasms. 07/19/16   Law, Waylan BogaAlexandra M, PA-C  diclofenac (VOLTAREN) 75 MG EC tablet Take 1 tablet (75 mg total) by mouth 2 (two) times daily. 09/11/14   Graylon GoodBaker, Zachary H, PA-C  doxycycline (VIBRAMYCIN) 100 MG capsule Take 1 capsule (100 mg total) by mouth 2 (two) times daily. 03/05/17   Dorena BodoKennard, Sharrell Krawiec, NP  ibuprofen (ADVIL,MOTRIN) 800 MG tablet Take 1 tablet (800 mg total) by mouth 3 (three)  times daily. 07/19/16   Law, Waylan BogaAlexandra M, PA-C  mycophenolate (CELLCEPT) 500 MG tablet Take 500 mg by mouth 2 (two) times daily.    [provider]  ondansetron (ZOFRAN) 4 MG tablet Take 1 tablet (4 mg total) by mouth every 8 (eight) hours as needed for nausea or vomiting. 11/06/15   Charm RingsHonig, Erin J, MD  predniSONE (STERAPRED UNI-PAK 21 TAB) 10 MG (21) TBPK tablet Take 6 tablets tomorrow, decrease by 1 each day till finished (9,1,4,7,8,2(6,5,4,3,2,1) 03/05/17   Dorena BodoKennard, Roni Friberg, NP   Meds Ordered and Administered this Visit   Medications  doxycycline (VIBRA-TABS) tablet 100 mg (100 mg Oral Given 03/05/17 1513)  famotidine (PEPCID) tablet 20 mg (20 mg Oral Given 03/05/17 1514)  diphenhydrAMINE (BENADRYL) capsule 50 mg (50 mg Oral Given 03/05/17 1514)  methylPREDNISolone acetate (DEPO-MEDROL) injection 80 mg (80 mg Intramuscular Given 03/05/17 1515)    BP 112/78 (BP Location: Left Arm)   Pulse 78   Temp 98 F (36.7 C) (Oral)   Resp 17   SpO2 100%  No data found.   Physical Exam  Constitutional: She is oriented to person, place, and time. She appears well-developed and well-nourished. No distress.  HENT:  Head: Normocephalic and atraumatic.  Right Ear: External ear normal.  Left Ear: External ear normal.  Eyes: Conjunctivae are normal.  Neck: Normal range of motion.  Cardiovascular: Normal rate and regular rhythm.   Pulmonary/Chest: Effort  normal and breath sounds normal.  Neurological: She is alert and oriented to person, place, and time.  Skin: Skin is warm and dry. Capillary refill takes less than 2 seconds. Rash noted. She is not diaphoretic. There is erythema.  Area of erythema on the lateral surface of the right hand, with a small lesion with purulent drainage. No red streaking observed. Full range of motion of the digits, sensory function intact, capillary refill less than 2 seconds.  Psychiatric: She has a normal mood and affect. Her behavior is normal.  Nursing note and vitals  reviewed.   Urgent Care Course     Procedures (including critical care time)  Labs Review Labs Reviewed - No data to display  Imaging Review No results found.     MDM   1. Insect bite, initial encounter   2. Cellulitis of right upper extremity     Given Depo-Medrol, Benadryl, Pepcid, doxycycline in clinic, continue over-the-counter Benadryl and Pepcid, started on doxycycline for cellulitis, and Medrol Dosepak. If red streaking occurs, go to the ER.    Dorena BodoKennard, Jonique Kulig, NP 03/05/17 1704

## 2017-03-05 NOTE — Discharge Instructions (Signed)
For your insect bite., Take over-the-counter Benadryl every 6 hours, not to exceed 300 mg a day. May also take over-the-counter Pepcid twice daily as well. For infection, started on doxycycline 1 tablet twice a day, and a Medrol Dosepak take starting tomorrow, as you have received an injection here. If the redness continues, or if you  see red streaking up your arm, go to the ER

## 2019-05-06 ENCOUNTER — Encounter (HOSPITAL_COMMUNITY): Payer: Self-pay

## 2019-05-06 ENCOUNTER — Other Ambulatory Visit: Payer: Self-pay

## 2019-05-06 ENCOUNTER — Ambulatory Visit (HOSPITAL_COMMUNITY)
Admission: EM | Admit: 2019-05-06 | Discharge: 2019-05-06 | Disposition: A | Discharge status: Home / Self Care | Payer: Self-pay | Attending: Family Medicine | Admitting: Family Medicine

## 2019-05-06 DIAGNOSIS — T63421A Toxic effect of venom of ants, accidental (unintentional), initial encounter: Secondary | ICD-10-CM

## 2019-05-06 HISTORY — DX: Systemic lupus erythematosus, unspecified: M32.9

## 2019-05-06 MED ORDER — METHYLPREDNISOLONE ACETATE 80 MG/ML IJ SUSP
INTRAMUSCULAR | Status: AC
  Filled 2019-05-06: qty 1

## 2019-05-06 MED ORDER — METHYLPREDNISOLONE ACETATE 80 MG/ML IJ SUSP
80.0000 mg | Freq: Once | INTRAMUSCULAR | Status: AC
  Administered 2019-05-06: 09:00:00 80 mg via INTRAMUSCULAR

## 2019-05-06 NOTE — ED Triage Notes (Signed)
Pt states she thinks she stepped in a ant hill This started Sunday. (Left ankle swelling )

## 2019-05-06 NOTE — Discharge Instructions (Addendum)
Elevate foot as much as you are able Limit walking Return if worse instead of better at any time

## 2019-05-06 NOTE — ED Provider Notes (Signed)
[facility]    CSN: [id number] Arrival date & time: 05/06/19  0818      History   Chief Complaint Chief Complaint  Patient presents with  . Insect Bite    HPI Stephanie Fitzgerald is a 48 y.o. female.   HPI  Patient states that she was taking a walk on Saturday.  Felt stinging in her left ankle.  She states that she is brushed off some insects, it was getting dark, she did not see what it was.  The next morning she noticed that she had small "pimples" all around her ankle.  It was stinging and bothering her somewhat.  She soaked her ankle in E[place] salt yesterday.  This caused it to swell.  She is here for swelling and discomfort in the left ankle.  It is not severely painful at rest.  She does have limited movement.  It hurts with walking. Patient has lupus that is currently not active  Past Medical History:  Diagnosis Date  . Lupus (HCC)     There are no active problems to display for this patient.   Past Surgical History:  Procedure Laterality Date  . TUBAL LIGATION      OB History   No obstetric history on file.      Home Medications    Prior to Admission medications   Medication Sig Start Date End Date Taking? Authorizing Provider  ibuprofen (ADVIL,MOTRIN) 800 MG tablet Take 1 tablet (800 mg total) by mouth 3 (three) times daily. 07/19/16   Law, Waylan Boga, PA-C  mycophenolate (CELLCEPT) 500 MG tablet Take 500 mg by mouth 2 (two) times daily.    [provider]    Family History History reviewed. No pertinent family history.  Social History Social History   Tobacco Use  . Smoking status: Current Every Day Smoker    Packs/day: 0.50    Types: Cigarettes  . Smokeless tobacco: Never Used  Substance Use Topics  . Alcohol use: Yes  . Drug use: No     Allergies   Patient has no known allergies.   Review of Systems Review of Systems  Constitutional: Negative for chills and fever.  HENT: Negative for ear pain and sore throat.    Eyes: Negative for pain and visual disturbance.  Respiratory: Negative for cough and shortness of breath.   Cardiovascular: Positive for leg swelling. Negative for chest pain and palpitations.  Gastrointestinal: Negative for abdominal pain and vomiting.  Genitourinary: Negative for dysuria and hematuria.  Musculoskeletal: Positive for gait problem. Negative for arthralgias and back pain.  Skin: Positive for color change and wound. Negative for rash.  Neurological: Negative for seizures and syncope.  All other systems reviewed and are negative.    Physical Exam Triage Vital Signs ED Triage Vitals  Enc Vitals Group     BP 05/06/19 0839 (!) 135/102     Pulse Rate 05/06/19 0839 75     Resp 05/06/19 0839 18     Temp 05/06/19 0839 98.3 F (36.8 C)     Temp Source 05/06/19 0839 Oral     SpO2 05/06/19 0839 100 %     Weight 05/06/19 0837 190 lb (86.2 kg)     Height --      Head Circumference --      Peak Flow --      Pain Score 05/06/19 0836 8     Pain Loc --      Pain Edu? --  Excl. in GC? --    No data found.  Updated Vital Signs BP (!) 135/102 (BP Location: Right Arm)   Pulse 75   Temp 98.3 F (36.8 C) (Oral)   Resp 18   Wt 86.2 kg   SpO2 100%   BMI 28.89 kg/m   Visual Acuity Right Eye Distance:   Left Eye Distance:   Bilateral Distance:    Right Eye Near:   Left Eye Near:    Bilateral Near:     Physical Exam Constitutional:      General: She is not in acute distress.    Appearance: She is well-developed.  HENT:     Head: Normocephalic and atraumatic.  Eyes:     Conjunctiva/sclera: Conjunctivae normal.     Pupils: Pupils are equal, round, and reactive to light.  Neck:     Musculoskeletal: Normal range of motion.  Cardiovascular:     Rate and Rhythm: Normal rate.  Pulmonary:     Effort: Pulmonary effort is normal. No respiratory distress.  Abdominal:     General: There is no distension.     Palpations: Abdomen is soft.  Musculoskeletal: Normal  range of motion.  Skin:    General: Skin is warm and dry.     Comments: Left ankle has multiple healing bites, approximately 8.  Soft tissue swelling from the malleoli down to the toes.  Nontender.  Slight warmth.  Slight erythema.  Neurological:     Mental Status: She is alert.  Psychiatric:        Mood and Affect: Mood normal.        Behavior: Behavior normal.      UC Treatments / Results  Labs (all labs ordered are listed, but only abnormal results are displayed) Labs Reviewed - No data to display  EKG   Radiology No results found.  Procedures Procedures (including critical care time)  Medications Ordered in UC Medications  methylPREDNISolone acetate (DEPO-MEDROL) injection 80 mg (has no administration in time range)    Initial Impression / Assessment and Plan / UC Course  I have reviewed the triage vital signs and the nursing notes.  Pertinent labs & imaging results that were available during my care of the patient were reviewed by me and considered in my medical decision making (see chart for details).     I told the patient that these are fire ants.  She should try cool compresses instead of heat.  This should resolve over time.  I think that this is more of an inflammatory reaction than infection.  We will treat her with steroids. Final Clinical Impressions(s) / UC Diagnoses   Final diagnoses:  Fire ant bite, accidental or unintentional, initial encounter     Discharge Instructions     Elevate foot as much as you are able Limit walking Return if worse instead of better at any time   ED Prescriptions    None     PDMP not reviewed this encounter.   Raylene Everts, MD 05/06/19 0900

## 2019-11-20 ENCOUNTER — Emergency Department (HOSPITAL_COMMUNITY)
Admission: EM | Admit: 2019-11-20 | Discharge: 2019-11-20 | Disposition: A | Discharge status: Home / Self Care | Payer: Self-pay | Attending: Emergency Medicine | Admitting: Emergency Medicine

## 2019-11-20 ENCOUNTER — Emergency Department (HOSPITAL_COMMUNITY): Payer: Self-pay

## 2019-11-20 ENCOUNTER — Encounter (HOSPITAL_COMMUNITY): Payer: Self-pay

## 2019-11-20 ENCOUNTER — Other Ambulatory Visit: Payer: Self-pay

## 2019-11-20 DIAGNOSIS — M545 Low back pain: Secondary | ICD-10-CM | POA: Insufficient documentation

## 2019-11-20 DIAGNOSIS — M3210 Systemic lupus erythematosus, organ or system involvement unspecified: Secondary | ICD-10-CM | POA: Insufficient documentation

## 2019-11-20 DIAGNOSIS — R52 Pain, unspecified: Secondary | ICD-10-CM

## 2019-11-20 DIAGNOSIS — R102 Pelvic and perineal pain: Secondary | ICD-10-CM

## 2019-11-20 DIAGNOSIS — F17210 Nicotine dependence, cigarettes, uncomplicated: Secondary | ICD-10-CM | POA: Insufficient documentation

## 2019-11-20 DIAGNOSIS — I10 Essential (primary) hypertension: Secondary | ICD-10-CM | POA: Insufficient documentation

## 2019-11-20 DIAGNOSIS — A5901 Trichomonal vulvovaginitis: Secondary | ICD-10-CM | POA: Insufficient documentation

## 2019-11-20 LAB — LIPASE, BLOOD: Lipase: 26 U/L (ref 11–51)

## 2019-11-20 LAB — CBC
HCT: 38.9 % (ref 36.0–46.0)
Hemoglobin: 12.2 g/dL (ref 12.0–15.0)
MCH: 30.6 pg (ref 26.0–34.0)
MCHC: 31.4 g/dL (ref 30.0–36.0)
MCV: 97.5 fL (ref 80.0–100.0)
Platelets: 261 10*3/uL (ref 150–400)
RBC: 3.99 MIL/uL (ref 3.87–5.11)
RDW: 11.9 % (ref 11.5–15.5)
WBC: 4.7 10*3/uL (ref 4.0–10.5)
nRBC: 0 % (ref 0.0–0.2)

## 2019-11-20 LAB — WET PREP, GENITAL
Clue Cells Wet Prep HPF POC: NONE SEEN
Sperm: NONE SEEN
Yeast Wet Prep HPF POC: NONE SEEN

## 2019-11-20 LAB — COMPREHENSIVE METABOLIC PANEL
ALT: 21 U/L (ref 0–44)
AST: 21 U/L (ref 15–41)
Albumin: 4.1 g/dL (ref 3.5–5.0)
Alkaline Phosphatase: 111 U/L (ref 38–126)
Anion gap: 10 (ref 5–15)
BUN: 9 mg/dL (ref 6–20)
CO2: 23 mmol/L (ref 22–32)
Calcium: 9.3 mg/dL (ref 8.9–10.3)
Chloride: 108 mmol/L (ref 98–111)
Creatinine, Ser: 0.87 mg/dL (ref 0.44–1.00)
GFR calc Af Amer: >60 mL/min (ref >60)
GFR calc non Af Amer: >60 mL/min (ref >60)
Glucose, Bld: 92 mg/dL (ref 70–99)
Potassium: 4.1 mmol/L (ref 3.5–5.1)
Sodium: 141 mmol/L (ref 135–145)
Total Bilirubin: 0.5 mg/dL (ref 0.3–1.2)
Total Protein: 7.5 g/dL (ref 6.5–8.1)

## 2019-11-20 LAB — URINALYSIS, ROUTINE W REFLEX MICROSCOPIC
Bilirubin Urine: NEGATIVE
Glucose, UA: NEGATIVE mg/dL
Ketones, ur: NEGATIVE mg/dL
Nitrite: NEGATIVE
Protein, ur: NEGATIVE mg/dL
Specific Gravity, Urine: 1.01 (ref 1.005–1.030)
pH: 5 (ref 5.0–8.0)

## 2019-11-20 LAB — I-STAT BETA HCG BLOOD, ED (MC, WL, AP ONLY): I-stat hCG, quantitative: <5 m[IU]/mL (ref <5)

## 2019-11-20 LAB — HIV ANTIBODY (ROUTINE TESTING W REFLEX): HIV Screen 4th Generation wRfx: NONREACTIVE

## 2019-11-20 MED ORDER — MORPHINE SULFATE (PF) 4 MG/ML IV SOLN
4.0000 mg | Freq: Once | INTRAVENOUS | Status: AC
  Administered 2019-11-20: 4 mg via INTRAVENOUS
  Filled 2019-11-20: qty 1

## 2019-11-20 MED ORDER — METRONIDAZOLE 500 MG PO TABS
2000.0000 mg | ORAL_TABLET | Freq: Once | ORAL | Status: AC
  Administered 2019-11-20: 2000 mg via ORAL
  Filled 2019-11-20: qty 4

## 2019-11-20 MED ORDER — SODIUM CHLORIDE 0.9 % IV SOLN
1.0000 g | Freq: Once | INTRAVENOUS | Status: AC
  Administered 2019-11-20: 13:00:00 1 g via INTRAVENOUS
  Filled 2019-11-20: qty 10

## 2019-11-20 MED ORDER — AZITHROMYCIN 1 G PO PACK
1.0000 g | PACK | Freq: Once | ORAL | Status: AC
  Administered 2019-11-20: 1 g via ORAL
  Filled 2019-11-20: qty 1

## 2019-11-20 MED ORDER — ONDANSETRON HCL 4 MG/2ML IJ SOLN
4.0000 mg | Freq: Once | INTRAMUSCULAR | Status: AC
  Administered 2019-11-20: 13:00:00 4 mg via INTRAVENOUS
  Filled 2019-11-20: qty 2

## 2019-11-20 NOTE — ED Triage Notes (Signed)
Pt reports lower back pain that radiates down to her pelvis. Denies n/v, no vaginal bleeding or discharge. Pt does report having a harder time with bowel movements but did have one this morning. Pt a.o

## 2019-11-20 NOTE — Discharge Instructions (Signed)
You are seen today for pelvic pain.  It appears that you have a sexually transmitted infection called trichomonas.  We use our still waiting on the results of your gonorrhea, chlamydia and HIV testing.  You were treated for trichomoniasis, gonorrhea and chlamydia today.  Please make sure you let your sexual partner know of your diagnosis so that they may be tested and treated as well.  Refrain from sexual activity until both of you have completed treatment.  Please follow-up with OB/GYN

## 2019-11-20 NOTE — ED Provider Notes (Signed)
[facility] Provider Note   CSN: [id number] Arrival date & time: 11/20/19  0815     History Chief Complaint  Patient presents with  . Back Pain  . Pelvic Pain    Stephanie Fitzgerald is a 49 y.o. female.  Patient is a 49 year old female with past medical history of lupus and hypertension who presents emergency department for pelvic pain.  Patient reports worsening pelvic pain over the last 4 days.  She reports history of tubal ligation in the past.  She reports her last period was about 6 months ago.  She does not see OB/GYN.  She reports she feels a sharp pain in her vaginal area.  She is sexually active but denies any vaginal bleeding or vaginal discharge or dysuria.  The pain radiates into her bilateral lower back.  Denies any fever, chills, nausea, vomiting.  She is eating and drinking normally.  She reports thinking that she has some sort of pelvic problem because that is the area of pain and it feels like when she last gave birth to 20 years ago.        Past Medical History:  Diagnosis Date  . Lupus ([place])     There are no problems to display for this patient.   Past Surgical History:  Procedure Laterality Date  . TUBAL LIGATION       OB History   No obstetric history on file.     No family history on file.  Social History   Tobacco Use  . Smoking status: Current Every Day Smoker    Packs/day: 0.50    Types: Cigarettes  . Smokeless tobacco: Never Used  Substance Use Topics  . Alcohol use: Yes  . Drug use: No    Home Medications Prior to Admission medications   Medication Sig Start Date End Date Taking? Authorizing Provider  ibuprofen (ADVIL,MOTRIN) 800 MG tablet Take 1 tablet (800 mg total) by mouth 3 (three) times daily. 07/19/16  Yes Law, Bea Graff, PA-C  triamcinolone ointment (KENALOG) 0.1 % Apply 1 application topically 2 (two) times daily as needed.  08/03/14  Yes [provider]    Allergies      Patient has no known allergies.  Review of Systems   Review of Systems  Constitutional: Negative for chills and fever.  HENT: Negative for congestion and sore throat.   Respiratory: Negative for cough and shortness of breath.   Cardiovascular: Negative for chest pain.  Gastrointestinal: Positive for abdominal pain. Negative for nausea and vomiting.  Endocrine: Negative for polyuria.  Genitourinary: Positive for pelvic pain and vaginal pain. Negative for decreased urine volume, difficulty urinating, dysuria, flank pain, frequency, genital sores, hematuria, urgency, vaginal bleeding and vaginal discharge.  Musculoskeletal: Positive for back pain.  Skin: Negative for rash and wound.  Neurological: Negative for dizziness.  Hematological: Does not bruise/bleed easily.    Physical Exam Updated Vital Signs BP (!) 136/107   Pulse (!) 54   Temp 98.7 F (37.1 C) (Oral)   Resp 15   Ht 5\' 6"  (1.676 m)   Wt 81.6 kg   LMP 05/22/2019   SpO2 100%   BMI 29.05 kg/m   Physical Exam Vitals and nursing note reviewed. Exam conducted with a chaperone present.  Constitutional:      Appearance: Normal appearance.  HENT:     Head: Normocephalic.     Mouth/Throat:     Mouth: Mucous membranes are moist.  Eyes:  Conjunctiva/sclera: Conjunctivae normal.  Pulmonary:     Effort: Pulmonary effort is normal.     Breath sounds: Normal breath sounds.  Abdominal:     General: Abdomen is flat.     Palpations: Abdomen is soft.     Tenderness: There is no abdominal tenderness.  Genitourinary:    Labia:        Right: No rash, tenderness, lesion or injury.        Left: No rash, tenderness, lesion or injury.      Vagina: No foreign body. Vaginal discharge (Mild, whitish-green) present. No erythema or bleeding.     Cervix: No cervical motion tenderness, discharge, lesion or cervical bleeding.     Uterus: Normal.      Adnexa: Right adnexa normal and left adnexa normal.       Right: No tenderness.          Left: No tenderness.    Skin:    General: Skin is dry.  Neurological:     Mental Status: She is alert.  Psychiatric:        Mood and Affect: Mood normal.     ED Results / Procedures / Treatments   Labs (all labs ordered are listed, but only abnormal results are displayed) Labs Reviewed  WET PREP, GENITAL - Abnormal; Notable for the following components:      Result Value   Trich, Wet Prep PRESENT (*)    WBC, Wet Prep HPF POC MANY (*)    All other components within normal limits  URINALYSIS, ROUTINE W REFLEX MICROSCOPIC - Abnormal; Notable for the following components:   APPearance HAZY (*)    Hgb urine dipstick SMALL (*)    Leukocytes,Ua MODERATE (*)    Bacteria, UA RARE (*)    All other components within normal limits  URINE CULTURE  LIPASE, BLOOD  COMPREHENSIVE METABOLIC PANEL  CBC  HIV ANTIBODY (ROUTINE TESTING W REFLEX)  RPR  I-STAT BETA HCG BLOOD, ED ([facility], [facility], [facility] ONLY)  GC/CHLAMYDIA PROBE AMP ([place]) NOT AT [facility]    EKG None  Radiology US PELVIC COMPLETE W TRANSVAGINAL AND TORSION R/O  Result Date: 11/20/2019 CLINICAL DATA:  Low back pain radiating to pelvis. EXAM: TRANSABDOMINAL AND TRANSVAGINAL ULTRASOUND OF PELVIS TECHNIQUE: Both transabdominal and transvaginal ultrasound examinations of the pelvis were performed. Transabdominal technique was performed for global imaging of the pelvis including uterus, ovaries, adnexal regions, and pelvic cul-de-sac. It was necessary to proceed with endovaginal exam following the transabdominal exam to visualize the ovaries. COMPARISON:  CT 06/29/2010 FINDINGS: Uterus Measurements: 7.9 x 4.2 x 5.2 cm = volume: 90 mL. Small hypodensity identified within the LEFT aspect of the lower uterine segment measuring 1.2 cm Endometrium Thickness: Normal at 4 mm.  No focal abnormality visualized. Right ovary Measurements: 2.0 x 1.0 x 2.2 cm = volume: 2.1 mL. Homogeneous echotexture. Left ovary Measurements: 2.7 x 1.4 x 2.0 cm = volume:  3.9 mL. Small 2 mm echogenic focus could represent a small calcification. Otherwise normal LEFT ovary. Other findings No abnormal free fluid. IMPRESSION: 1. Essentially normal uterus and ovaries. 2. Probable small leiomyoma in the lower uterine segment. 3. Normal color Doppler flow to the LEFT and RIGHT ovary. Electronically Signed   By: Genevive Bi M.D.   On: 11/20/2019 11:10    Procedures Procedures (including critical care time)  Medications Ordered in ED Medications  cefTRIAXone (ROCEPHIN) 1 g in sodium chloride 0.9 % 100 mL IVPB (1 g Intravenous New Bag/Given 11/20/19 1309)  morphine 4 MG/ML injection 4 mg (4 mg Intravenous Given 11/20/19 1007)  metroNIDAZOLE (FLAGYL) tablet 2,000 mg (2,000 mg Oral Given 11/20/19 1309)  azithromycin (ZITHROMAX) powder 1 g (1 g Oral Given 11/20/19 1310)  ondansetron (ZOFRAN) injection 4 mg (4 mg Intravenous Given 11/20/19 1309)    ED Course  I have reviewed the triage vital signs and the nursing notes.  Pertinent labs & imaging results that were available during my care of the patient were reviewed by me and considered in my medical decision making (see chart for details).  Clinical Course as of Nov 19 1324  Thu Nov 20, 2019  4496 Pelvic ultrasound and blood work are reassuring.  However, patient had moderate leukocytes, white blood cells and rare bacteria and a wet prep that was positive for trichomoniasis.  I discussed the findings with the patient.  She will be treated with Rocephin, Zithromax and metronidazole.  She was advised no sexual intercourse until her partner is tested and treated as well.  She will follow-up with OB/GYN.  She is otherwise afebrile and well-appearing and her pain is controlled. She had no CMT to suggest PID. She may be discharged home.   [KM]  1660 49 year old female presenting with pelvic pain for 4 days.  Appears in significant amount of pain when I enter the room but pelvic exam is otherwise benign.  Plan to obtain pelvic  ultrasound and labs.   [KM]    Clinical Course User Index [KM] Jeral Pinch   MDM Rules/Calculators/A&P                      Based on review of vitals, medical screening exam, lab work and/or imaging, there does not appear to be an acute, emergent etiology for the patient's symptoms. Counseled pt on good return precautions and encouraged both PCP and ED follow-up as needed.  Prior to discharge, I also discussed incidental imaging findings with patient in detail and advised appropriate, recommended follow-up in detail.  Clinical Impression: 1. Vaginal trichomoniasis   2. Pain   3. Pelvic pain     Disposition: Discharge  Prior to providing a prescription for a controlled substance, I independently reviewed the patient's recent prescription history on the [place] Controlled Substance Reporting System. The patient had no recent or regular prescriptions and was deemed appropriate for a brief, less than 3 day prescription of narcotic for acute analgesia.  This note was prepared with assistance of Conservation officer, historic buildings. Occasional wrong-word or sound-a-like substitutions may have occurred due to the inherent limitations of voice recognition software.  Final Clinical Impression(s) / ED Diagnoses Final diagnoses:  Vaginal trichomoniasis  Pelvic pain    Rx / DC Orders ED Discharge Orders    None       Jeral Pinch 11/20/19 1326    Mancel Bale, MD 11/20/19 1645

## 2019-11-20 NOTE — ED Notes (Signed)
Patient transported to Ultrasound 

## 2019-11-21 LAB — GC/CHLAMYDIA PROBE AMP (~~LOC~~) NOT AT ARMC
Chlamydia: NEGATIVE
Comment: NEGATIVE
Comment: NORMAL
Neisseria Gonorrhea: NEGATIVE

## 2019-11-21 LAB — URINE CULTURE

## 2019-11-21 LAB — RPR: RPR Ser Ql: NONREACTIVE

## 2019-11-24 ENCOUNTER — Other Ambulatory Visit: Payer: Self-pay

## 2019-11-24 ENCOUNTER — Emergency Department (HOSPITAL_COMMUNITY): Payer: Self-pay

## 2019-11-24 ENCOUNTER — Emergency Department (HOSPITAL_COMMUNITY)
Admission: EM | Admit: 2019-11-24 | Discharge: 2019-11-24 | Disposition: A | Discharge status: Home Health | Payer: Self-pay | Attending: Emergency Medicine | Admitting: Emergency Medicine

## 2019-11-24 ENCOUNTER — Encounter (HOSPITAL_COMMUNITY): Payer: Self-pay

## 2019-11-24 DIAGNOSIS — R197 Diarrhea, unspecified: Secondary | ICD-10-CM | POA: Insufficient documentation

## 2019-11-24 DIAGNOSIS — F17210 Nicotine dependence, cigarettes, uncomplicated: Secondary | ICD-10-CM | POA: Insufficient documentation

## 2019-11-24 DIAGNOSIS — M5126 Other intervertebral disc displacement, lumbar region: Secondary | ICD-10-CM | POA: Insufficient documentation

## 2019-11-24 DIAGNOSIS — R102 Pelvic and perineal pain: Secondary | ICD-10-CM | POA: Insufficient documentation

## 2019-11-24 LAB — COMPREHENSIVE METABOLIC PANEL
ALT: 20 U/L (ref 0–44)
AST: 21 U/L (ref 15–41)
Albumin: 4.2 g/dL (ref 3.5–5.0)
Alkaline Phosphatase: 100 U/L (ref 38–126)
Anion gap: 9 (ref 5–15)
BUN: 11 mg/dL (ref 6–20)
CO2: 22 mmol/L (ref 22–32)
Calcium: 9.2 mg/dL (ref 8.9–10.3)
Chloride: 107 mmol/L (ref 98–111)
Creatinine, Ser: 0.76 mg/dL (ref 0.44–1.00)
GFR calc Af Amer: >60 mL/min (ref >60)
GFR calc non Af Amer: >60 mL/min (ref >60)
Glucose, Bld: 101 mg/dL — ABNORMAL HIGH (ref 70–99)
Potassium: 4.1 mmol/L (ref 3.5–5.1)
Sodium: 138 mmol/L (ref 135–145)
Total Bilirubin: 0.4 mg/dL (ref 0.3–1.2)
Total Protein: 7.7 g/dL (ref 6.5–8.1)

## 2019-11-24 LAB — CBC WITH DIFFERENTIAL/PLATELET
Abs Immature Granulocytes: 0.01 10*3/uL (ref 0.00–0.07)
Basophils Absolute: 0 10*3/uL (ref 0.0–0.1)
Basophils Relative: 1 %
Eosinophils Absolute: 0 10*3/uL (ref 0.0–0.5)
Eosinophils Relative: 0 %
HCT: 39.1 % (ref 36.0–46.0)
Hemoglobin: 12.3 g/dL (ref 12.0–15.0)
Immature Granulocytes: 0 %
Lymphocytes Relative: 44 %
Lymphs Abs: 2.5 10*3/uL (ref 0.7–4.0)
MCH: 30.4 pg (ref 26.0–34.0)
MCHC: 31.5 g/dL (ref 30.0–36.0)
MCV: 96.8 fL (ref 80.0–100.0)
Monocytes Absolute: 0.3 10*3/uL (ref 0.1–1.0)
Monocytes Relative: 5 %
Neutro Abs: 2.7 10*3/uL (ref 1.7–7.7)
Neutrophils Relative %: 50 %
Platelets: 235 10*3/uL (ref 150–400)
RBC: 4.04 MIL/uL (ref 3.87–5.11)
RDW: 11.6 % (ref 11.5–15.5)
WBC: 5.5 10*3/uL (ref 4.0–10.5)
nRBC: 0 % (ref 0.0–0.2)

## 2019-11-24 LAB — LIPASE, BLOOD: Lipase: 23 U/L (ref 11–51)

## 2019-11-24 MED ORDER — ONDANSETRON HCL 4 MG/2ML IJ SOLN
4.0000 mg | Freq: Once | INTRAMUSCULAR | Status: AC
  Administered 2019-11-24: 4 mg via INTRAVENOUS
  Filled 2019-11-24: qty 2

## 2019-11-24 MED ORDER — PREDNISONE 10 MG (21) PO TBPK: ORAL_TABLET | ORAL | 0 refills | Status: DC

## 2019-11-24 MED ORDER — HYDROCODONE-ACETAMINOPHEN 5-325 MG PO TABS: 1.0000 | ORAL_TABLET | ORAL | 0 refills | Status: DC | PRN

## 2019-11-24 MED ORDER — MORPHINE SULFATE (PF) 4 MG/ML IV SOLN
4.0000 mg | Freq: Once | INTRAVENOUS | Status: AC
  Administered 2019-11-24: 4 mg via INTRAVENOUS
  Filled 2019-11-24: qty 1

## 2019-11-24 MED ORDER — IOHEXOL 300 MG/ML  SOLN
100.0000 mL | Freq: Once | INTRAMUSCULAR | Status: AC | PRN
  Administered 2019-11-24: 100 mL via INTRAVENOUS

## 2019-11-24 MED ORDER — SODIUM CHLORIDE 0.9 % IV BOLUS
1000.0000 mL | Freq: Once | INTRAVENOUS | Status: AC
  Administered 2019-11-24: 12:00:00 1000 mL via INTRAVENOUS

## 2019-11-24 MED ORDER — DEXAMETHASONE SODIUM PHOSPHATE 10 MG/ML IJ SOLN
10.0000 mg | Freq: Once | INTRAMUSCULAR | Status: AC
  Administered 2019-11-24: 10 mg via INTRAVENOUS
  Filled 2019-11-24: qty 1

## 2019-11-24 NOTE — ED Notes (Signed)
Patient made aware that we need a UA; Pelvic cart set up at bedside. Patient transported to CT at this time.

## 2019-11-24 NOTE — ED Provider Notes (Signed)
[facility] Provider Note   CSN: [id number] Arrival date & time: 11/24/19  0092     History Chief Complaint  Patient presents with  . Vaginal Pain    Stephanie Fitzgerald is a 49 y.o. female.  Pt presents to the ED today with abdominal and vaginal pain.  Pt was seen in the ED on 4/15 for the same.  She was diagnosed with trich and treated for STDs with rocephin/zithromax.  She said she is not any better.  She denies any f/c.  She's had diarrhea, but no vomiting.        Past Medical History:  Diagnosis Date  . Lupus (HCC)     There are no problems to display for this patient.   Past Surgical History:  Procedure Laterality Date  . TUBAL LIGATION       OB History   No obstetric history on file.     No family history on file.  Social History   Tobacco Use  . Smoking status: Current Every Day Smoker    Packs/day: 0.50    Types: Cigarettes  . Smokeless tobacco: Never Used  Substance Use Topics  . Alcohol use: Not Currently  . Drug use: No    Home Medications Prior to Admission medications   Medication Sig Start Date End Date Taking? Authorizing Provider  ibuprofen (ADVIL) 200 MG tablet Take 200 mg by mouth every 6 (six) hours as needed for moderate pain.   Yes [provider]  triamcinolone ointment (KENALOG) 0.1 % Apply 1 application topically 2 (two) times daily as needed.  08/03/14  Yes [provider]  HYDROcodone-acetaminophen (NORCO/VICODIN) 5-325 MG tablet Take 1 tablet by mouth every 4 (four) hours as needed. 11/24/19   Jacalyn Lefevre, MD  ibuprofen (ADVIL,MOTRIN) 800 MG tablet Take 1 tablet (800 mg total) by mouth 3 (three) times daily. Patient not taking: Reported on 11/24/2019 07/19/16   Emi Holes, PA-C  predniSONE (STERAPRED UNI-PAK 21 TAB) 10 MG (21) TBPK tablet Take 6 tabs for 2 days, then 5 for 2 days, then 4 for 2 days, then 3 for 2 days, 2 for 2 days, then 1 for 2 days 11/24/19   Jacalyn Lefevre, MD    Allergies    Patient has no known allergies.  Review of Systems   Review of Systems  Gastrointestinal: Positive for abdominal pain and diarrhea.  All other systems reviewed and are negative.   Physical Exam Updated Vital Signs BP (!) 126/92   Pulse (!) 57   Temp 98.8 F (37.1 C) (Oral)   Resp 16   Ht 5\' 6"  (1.676 m)   Wt 81.6 kg   SpO2 98%   BMI 29.05 kg/m   Physical Exam Vitals and nursing note reviewed. Exam conducted with a chaperone present.  Constitutional:      Appearance: Normal appearance.  HENT:     Head: Normocephalic and atraumatic.     Right Ear: External ear normal.     Left Ear: External ear normal.     Nose: Nose normal.     Mouth/Throat:     Mouth: Mucous membranes are moist.     Pharynx: Oropharynx is clear.  Eyes:     Extraocular Movements: Extraocular movements intact.     Conjunctiva/sclera: Conjunctivae normal.     Pupils: Pupils are equal, round, and reactive to light.  Cardiovascular:     Rate and Rhythm: Normal rate and regular rhythm.  Pulses: Normal pulses.     Heart sounds: Normal heart sounds.  Pulmonary:     Effort: Pulmonary effort is normal.     Breath sounds: Normal breath sounds.  Abdominal:     General: Abdomen is flat. Bowel sounds are normal.     Palpations: Abdomen is soft.     Tenderness: There is generalized abdominal tenderness.  Genitourinary:    Exam position: Lithotomy position.     Vagina: Normal.     Cervix: Normal.     Uterus: Normal.      Adnexa: Right adnexa normal.  Musculoskeletal:        General: Normal range of motion.     Cervical back: Normal range of motion and neck supple.  Skin:    General: Skin is warm.     Capillary Refill: Capillary refill takes less than 2 seconds.  Neurological:     General: No focal deficit present.     Mental Status: She is alert and oriented to person, place, and time.  Psychiatric:        Mood and Affect: Mood normal.        Behavior: Behavior normal.         Thought Content: Thought content normal.        Judgment: Judgment normal.     ED Results / Procedures / Treatments   Labs (all labs ordered are listed, but only abnormal results are displayed) Labs Reviewed  COMPREHENSIVE METABOLIC PANEL - Abnormal; Notable for the following components:      Result Value   Glucose, Bld 101 (*)    All other components within normal limits  CBC WITH DIFFERENTIAL/PLATELET  LIPASE, BLOOD  URINALYSIS, ROUTINE W REFLEX MICROSCOPIC    EKG None  Radiology CT Abdomen Pelvis W Contrast  Result Date: 11/24/2019 CLINICAL DATA:  Abdominal pain. EXAM: CT ABDOMEN AND PELVIS WITH CONTRAST TECHNIQUE: Multidetector CT imaging of the abdomen and pelvis was performed using the standard protocol following bolus administration of intravenous contrast. CONTRAST:  OMNIPAQUE IOHEXOL 300 MG/ML  SOLN COMPARISON:  06/29/2010 FINDINGS: Lower chest: Normal. Hepatobiliary: No focal liver abnormality is seen. No gallstones, gallbladder wall thickening, or biliary dilatation. Pancreas: Unremarkable. No pancreatic ductal dilatation or surrounding inflammatory changes. Spleen: Normal in size without focal abnormality. Adrenals/Urinary Tract: Adrenal glands are unremarkable. Kidneys are normal, without renal calculi, focal lesion, or hydronephrosis. Bladder is unremarkable. Stomach/Bowel: Stomach is within normal limits. Appendix appears normal. No evidence of bowel wall thickening, distention, or inflammatory changes. Multiple diverticula in the ascending colon. No diverticulitis. Vascular/Lymphatic: No significant vascular findings are present. No enlarged abdominal or pelvic lymph nodes. Reproductive: Uterus and bilateral adnexa are unremarkable. Other: No abdominal wall hernia or abnormality. No abdominopelvic ascites. Musculoskeletal: The patient has a large central soft disc extrusion at L5-S1 which compresses the thecal sac and could affect either or both S1 nerves. There  is a small broad-based disc bulge at L4-5. IMPRESSION: 1. Large central soft disc extrusion at L5-S1 which compresses the thecal sac and could affect either or both S1 nerves. 2. Diverticulosis of the ascending colon. 3. Otherwise benign-appearing abdomen and pelvis. Electronically Signed   By: Francene Boyers M.D.   On: 11/24/2019 12:15    Procedures Procedures (including critical care time)  Medications Ordered in ED Medications  dexamethasone (DECADRON) injection 10 mg (has no administration in time range)  morphine 4 MG/ML injection 4 mg (4 mg Intravenous Given 11/24/19 1149)  ondansetron (ZOFRAN) injection 4 mg (4 mg  Intravenous Given 11/24/19 1149)  sodium chloride 0.9 % bolus 1,000 mL (0 mLs Intravenous Stopped 11/24/19 1305)  iohexol (OMNIPAQUE) 300 MG/ML solution 100 mL (100 mLs Intravenous Contrast Given 11/24/19 1158)    ED Course  I have reviewed the triage vital signs and the nursing notes.  Pertinent labs & imaging results that were available during my care of the patient were reviewed by me and considered in my medical decision making (see chart for details).    MDM Rules/Calculators/A&P                      Pt d/w Dr. Christella Noa who does not think this disc protrusion is causing her pain, but said she can follow up in the office.  Nothing acutely from NS perspective needs to be done.  Pt is feeling much better after treatment.  I did not repeat vaginal swabs as they were just done.  Pt also told to f/u with gyn.  Final Clinical Impression(s) / ED Diagnoses Final diagnoses:  Vaginal pain  Lumbar disc herniation    Rx / DC Orders ED Discharge Orders         Ordered    predniSONE (STERAPRED UNI-PAK 21 TAB) 10 MG (21) TBPK tablet     11/24/19 1305    HYDROcodone-acetaminophen (NORCO/VICODIN) 5-325 MG tablet  Every 4 hours PRN     11/24/19 1305           Isla Pence, MD 11/24/19 1306

## 2019-11-24 NOTE — ED Triage Notes (Signed)
Patient reports that she was treated on 4/15 for trichomonas at [facility] ED and states her vaginal pain is worse today.

## 2019-12-03 ENCOUNTER — Other Ambulatory Visit: Payer: Self-pay

## 2019-12-03 ENCOUNTER — Emergency Department (HOSPITAL_COMMUNITY)
Admission: EM | Admit: 2019-12-03 | Discharge: 2019-12-03 | Disposition: A | Discharge status: Home / Self Care | Payer: Medicaid Other | Attending: Emergency Medicine | Admitting: Emergency Medicine

## 2019-12-03 ENCOUNTER — Encounter (HOSPITAL_COMMUNITY): Payer: Self-pay | Admitting: Emergency Medicine

## 2019-12-03 DIAGNOSIS — Z5321 Procedure and treatment not carried out due to patient leaving prior to being seen by health care provider: Secondary | ICD-10-CM | POA: Insufficient documentation

## 2019-12-03 DIAGNOSIS — R200 Anesthesia of skin: Secondary | ICD-10-CM | POA: Insufficient documentation

## 2019-12-03 NOTE — ED Triage Notes (Signed)
Pt reports that she was seen here a couple weeks ago for her back. Reports she has a split disc. Reports continued to have right leg numbness down leg and into foot. No falls or injuries.

## 2019-12-03 NOTE — ED Notes (Signed)
Per registration, patient turned in patient labels and reports they are leaving.

## 2019-12-08 ENCOUNTER — Other Ambulatory Visit: Payer: Self-pay

## 2019-12-08 ENCOUNTER — Encounter (HOSPITAL_COMMUNITY): Payer: Self-pay | Admitting: Emergency Medicine

## 2019-12-08 ENCOUNTER — Emergency Department (HOSPITAL_COMMUNITY)
Admission: EM | Admit: 2019-12-08 | Discharge: 2019-12-08 | Disposition: A | Discharge status: Home / Self Care | Payer: Medicaid Other | Attending: Emergency Medicine | Admitting: Emergency Medicine

## 2019-12-08 DIAGNOSIS — F17210 Nicotine dependence, cigarettes, uncomplicated: Secondary | ICD-10-CM | POA: Insufficient documentation

## 2019-12-08 DIAGNOSIS — M5417 Radiculopathy, lumbosacral region: Secondary | ICD-10-CM

## 2019-12-08 DIAGNOSIS — Z79899 Other long term (current) drug therapy: Secondary | ICD-10-CM | POA: Insufficient documentation

## 2019-12-08 MED ORDER — MELOXICAM 7.5 MG PO TABS
7.5000 mg | ORAL_TABLET | Freq: Every day | ORAL | 0 refills | Status: DC
Start: 2019-12-08 — End: 2021-03-10

## 2019-12-08 MED ORDER — HYDROCODONE-ACETAMINOPHEN 5-325 MG PO TABS: 1.0000 | ORAL_TABLET | Freq: Four times a day (QID) | ORAL | 0 refills | Status: DC | PRN

## 2019-12-08 MED ORDER — PREDNISONE 10 MG (21) PO TBPK
ORAL_TABLET | ORAL | 0 refills | Status: DC
Start: 2019-12-08 — End: 2021-03-10

## 2019-12-08 NOTE — ED Triage Notes (Signed)
Patient is complaining of right leg numbness down leg and into right foot.

## 2019-12-08 NOTE — Discharge Instructions (Signed)
Please read and follow all provided instructions.  Your diagnoses today include:  1. Lumbosacral radiculopathy at S1     Tests performed today include:  Vital signs - see below for your results today  Medications prescribed:   Prednisone - steroid medicine   It is best to take this medication in the morning to prevent sleeping problems. If you are diabetic, monitor your blood sugar closely and stop taking Prednisone if blood sugar is over 300. Take with food to prevent stomach upset.    Vicodin (hydrocodone/acetaminophen) - narcotic pain medication  DO NOT drive or perform any activities that require you to be awake and alert because this medicine can make you drowsy. BE VERY CAREFUL not to take multiple medicines containing Tylenol (also called acetaminophen). Doing so can lead to an overdose which can damage your liver and cause liver failure and possibly death.   Meloxicam - anti-inflammatory pain medication  You have been prescribed an anti-inflammatory medication or NSAID. Take with food. Do not take aspirin, ibuprofen, or naproxen if taking this medication. Take smallest effective dose for the shortest duration needed for your pain. Stop taking if you experience stomach pain or vomiting.   Take any prescribed medications only as directed.  Home care instructions:   Follow any educational materials contained in this packet  Please rest, use ice or heat on your back for the next several days  Do not lift, push, pull anything more than 10 pounds for the next week  Follow-up instructions:  As we discussed, it will be very important for you to follow-up with the neurosurgeon referral as they are the ones who will be able to help treat your bulging disc.  *Return instructions*  SEEK IMMEDIATE MEDICAL ATTENTION IF YOU HAVE:  New numbness, tingling, weakness, or problem with the use of your arms or legs  Severe back pain not relieved with medications  Loss control of your  bowels or bladder  Increasing pain in any areas of the body (such as chest or abdominal pain)  Shortness of breath, dizziness, or fainting.   Worsening nausea (feeling sick to your stomach), vomiting, fever, or sweats  Any other emergent concerns regarding your health   Additional Information:  Your vital signs today were: BP (!) 166/115   Pulse 83   Temp 98.5 F (36.9 C) (Oral)   Resp 19   Ht 5\' 7"  (1.702 m)   Wt 90.7 kg   SpO2 96%   BMI 31.32 kg/m  If your blood pressure (BP) was elevated above 135/85 this visit, please have this repeated by your doctor within one month. --------------

## 2019-12-08 NOTE — ED Provider Notes (Signed)
[facility] DEPT Provider Note   CSN: [id number] Arrival date & time: 12/08/19  2703     History Chief Complaint  Patient presents with  . Numbness    Stephanie Fitzgerald is a 49 y.o. female.  Patient with recently diagnosed disc bulge at L5/S1, history of lupus -- presents the emergency department with ongoing right leg numbness and pain.  Patient describes pain starting in her posterior buttocks traveling down the back of her leg, calf, to the bottom of her foot.  She states that she has numbness on the lateral aspect of her foot.  She denies any weakness or foot drop.  She is able to walk.  She states that the symptoms cause her difficulty sleeping.  Patient was seen in the emergency department in mid April for "pain in my back".  She had a CT scan of the abdomen and pelvis which demonstrated a disc problem.  She was placed on a taper course of prednisone, Vicodin.  Her back pain resolved however continues to have leg symptoms.  This seemed to help somewhat but did not resolve her symptoms.  She has recently completed steroid course.  She did not follow-up with neurosurgery referral.  Patient denies warning symptoms of back pain including: fecal incontinence, urinary retention or overflow incontinence, night sweats, unexplained fevers or weight loss, h/o cancer, IVDU, recent trauma.    CT 4/17:  The patient has a large central soft disc extrusion at L5-S1 which compresses the thecal sac and could affect either or both S1 nerves. There is a small broad-based disc bulge at L4-5.        Past Medical History:  Diagnosis Date  . Lupus ([place])     There are no problems to display for this patient.   Past Surgical History:  Procedure Laterality Date  . TUBAL LIGATION       OB History   No obstetric history on file.     History reviewed. No pertinent family history.  Social History   Tobacco Use  . Smoking status: Current Every Day Smoker   Packs/day: 0.50    Types: Cigarettes  . Smokeless tobacco: Never Used  Substance Use Topics  . Alcohol use: Not Currently  . Drug use: No    Home Medications Prior to Admission medications   Medication Sig Start Date End Date Taking? Authorizing Provider  HYDROcodone-acetaminophen (NORCO/VICODIN) 5-325 MG tablet Take 1 tablet by mouth every 6 (six) hours as needed. 12/08/19   Carlisle Cater, PA-C  meloxicam (MOBIC) 7.5 MG tablet Take 1 tablet (7.5 mg total) by mouth daily. 12/08/19   Carlisle Cater, PA-C  predniSONE (STERAPRED UNI-PAK 21 TAB) 10 MG (21) TBPK tablet Take 6 tabs for 2 days, then 5 for 2 days, then 4 for 2 days, then 3 for 2 days, 2 for 2 days, then 1 for 2 days 12/08/19   Carlisle Cater, PA-C  triamcinolone ointment (KENALOG) 0.1 % Apply 1 application topically 2 (two) times daily as needed.  08/03/14   [provider]    Allergies    Patient has no known allergies.  Review of Systems   Review of Systems  Constitutional: Negative for fever and unexpected weight change.  Gastrointestinal: Negative for constipation.       Negative for fecal incontinence.   Genitourinary: Negative for dysuria, flank pain, hematuria and pelvic pain.       Negative for urinary incontinence or retention.  Musculoskeletal: Positive for back pain.  Neurological:  Positive for numbness. Negative for weakness.       Denies saddle paresthesias.    Physical Exam Updated Vital Signs BP (!) 166/115   Pulse 83   Temp 98.5 F (36.9 C) (Oral)   Resp 19   Ht 5\' 7"  (1.702 m)   Wt 90.7 kg   SpO2 96%   BMI 31.32 kg/m   Physical Exam Vitals and nursing note reviewed.  Constitutional:      Appearance: She is well-developed.  HENT:     Head: Normocephalic and atraumatic.  Eyes:     Conjunctiva/sclera: Conjunctivae normal.  Pulmonary:     Effort: Pulmonary effort is normal.  Abdominal:     Palpations: Abdomen is soft.     Tenderness: There is no abdominal tenderness.    Musculoskeletal:        General: Normal range of motion.     Cervical back: Normal range of motion and neck supple.     Thoracic back: Normal.     Lumbar back: Normal. No tenderness or bony tenderness.     Comments: No step-off noted with palpation of spine.   Skin:    General: Skin is warm and dry.     Findings: No rash.  Neurological:     Mental Status: She is alert.     Sensory: No sensory deficit.     Deep Tendon Reflexes: Reflexes are normal and symmetric.     Comments: 5/5 strength in entire lower extremities bilaterally. Patient reports numbness to touch over the lateral portion of the sole and foot.     ED Results / Procedures / Treatments   Labs (all labs ordered are listed, but only abnormal results are displayed) Labs Reviewed - No data to display  EKG None  Radiology No results found.  Procedures Procedures (including critical care time)  Medications Ordered in ED Medications - No data to display  ED Course  I have reviewed the triage vital signs and the nursing notes.  Pertinent labs & imaging results that were available during my care of the patient were reviewed by me and considered in my medical decision making (see chart for details).  7:32 AM Patient seen and examined.  I reviewed patient's previous imaging.  Vital signs reviewed and are as follows: Vitals:   12/08/19 0653  BP: (!) 166/115  Pulse: 83  Resp: 19  Temp: 98.5 F (36.9 C)  SpO2: 96%    Symptoms are consistent with right sided lumbosacral radiculopathy.  Fortunately she does not have any red flag s/s today which would necessitate emergent MRI or emergent neurosurgery consultation.    We will place on an additional course of prednisone in hopes that she can follow-up with neurosurgery in the interim.  Also prescribed Mobic and Vicodin.  Patient counseled on use of narcotic pain medications. Counseled not to combine these medications with others containing tylenol. Urged not to  drink alcohol, drive, or perform any other activities that requires focus while taking these medications. The patient verbalizes understanding and agrees with the plan.  We spent time discussing signs and symptoms which should cause her to return to the emergency department this includes worsening weakness in her legs, especially if it is bilateral, fevers, urinary retention/incontinence and fecal incontinence.  Also return with worsening control pain.  The patient verbalizes understanding and agrees with the plan.    MDM Rules/Calculators/A&P  Patient with leg numbness and pain consistent with lumbosacral radiculopathy.  This is likely related to disc bulging noted on previous CT scan.  Patient does have numbness and sensation decrease in her right foot.  She does not have any weakness.  No foot drop or difficulty with ambulation.  No warning symptoms of back pain including: fecal incontinence, urinary retention or overflow incontinence, night sweats, waking from sleep with back pain, unexplained fevers or weight loss, h/o cancer, IVDU, recent trauma. No concern for cauda equina, epidural abscess.  Conservative measures such as rest, ice/heat and pain medicine indicated with neurosurgery follow-up due to persistent symptoms.  Final Clinical Impression(s) / ED Diagnoses Final diagnoses:  Lumbosacral radiculopathy at S1    Rx / DC Orders ED Discharge Orders         Ordered    predniSONE (STERAPRED UNI-PAK 21 TAB) 10 MG (21) TBPK tablet     12/08/19 0724    HYDROcodone-acetaminophen (NORCO/VICODIN) 5-325 MG tablet  Every 6 hours PRN     12/08/19 0724    meloxicam (MOBIC) 7.5 MG tablet  Daily     12/08/19 0724           Renne Crigler, PA-C 12/08/19 7290    Derwood Kaplan, MD 12/08/19 1128

## 2019-12-16 ENCOUNTER — Other Ambulatory Visit (HOSPITAL_COMMUNITY): Payer: Self-pay | Admitting: Neurological Surgery

## 2019-12-16 ENCOUNTER — Other Ambulatory Visit: Payer: Self-pay | Admitting: Neurological Surgery

## 2019-12-16 DIAGNOSIS — M5126 Other intervertebral disc displacement, lumbar region: Secondary | ICD-10-CM

## 2019-12-26 ENCOUNTER — Other Ambulatory Visit: Payer: Self-pay

## 2019-12-26 ENCOUNTER — Ambulatory Visit (HOSPITAL_COMMUNITY)
Admission: RE | Admit: 2019-12-26 | Discharge: 2019-12-26 | Disposition: A | Discharge status: Home / Self Care | Payer: Self-pay | Source: Ambulatory Visit | Attending: Neurological Surgery | Admitting: Neurological Surgery

## 2019-12-26 DIAGNOSIS — M5126 Other intervertebral disc displacement, lumbar region: Secondary | ICD-10-CM | POA: Insufficient documentation

## 2021-03-10 ENCOUNTER — Emergency Department (HOSPITAL_BASED_OUTPATIENT_CLINIC_OR_DEPARTMENT_OTHER)
Admission: EM | Admit: 2021-03-10 | Discharge: 2021-03-10 | Disposition: A | Discharge status: Home / Self Care | Payer: Self-pay | Attending: Emergency Medicine | Admitting: Emergency Medicine

## 2021-03-10 ENCOUNTER — Encounter (HOSPITAL_BASED_OUTPATIENT_CLINIC_OR_DEPARTMENT_OTHER): Payer: Self-pay | Admitting: *Deleted

## 2021-03-10 ENCOUNTER — Encounter (HOSPITAL_COMMUNITY): Payer: Self-pay

## 2021-03-10 ENCOUNTER — Ambulatory Visit (HOSPITAL_COMMUNITY)
Admission: EM | Admit: 2021-03-10 | Discharge: 2021-03-10 | Disposition: A | Discharge status: Home / Self Care | Payer: Medicaid Other

## 2021-03-10 ENCOUNTER — Other Ambulatory Visit: Payer: Self-pay

## 2021-03-10 DIAGNOSIS — F17210 Nicotine dependence, cigarettes, uncomplicated: Secondary | ICD-10-CM | POA: Insufficient documentation

## 2021-03-10 DIAGNOSIS — W57XXXA Bitten or stung by nonvenomous insect and other nonvenomous arthropods, initial encounter: Secondary | ICD-10-CM | POA: Insufficient documentation

## 2021-03-10 DIAGNOSIS — T63441A Toxic effect of venom of bees, accidental (unintentional), initial encounter: Secondary | ICD-10-CM | POA: Insufficient documentation

## 2021-03-10 DIAGNOSIS — H5789 Other specified disorders of eye and adnexa: Secondary | ICD-10-CM

## 2021-03-10 DIAGNOSIS — H05222 Edema of left orbit: Secondary | ICD-10-CM | POA: Insufficient documentation

## 2021-03-10 DIAGNOSIS — T63481A Toxic effect of venom of other arthropod, accidental (unintentional), initial encounter: Secondary | ICD-10-CM

## 2021-03-10 MED ORDER — PREDNISONE 20 MG PO TABS
ORAL_TABLET | ORAL | 0 refills | Status: DC
Start: 2021-03-11 — End: 2022-05-08

## 2021-03-10 MED ORDER — PREDNISONE 50 MG PO TABS
60.0000 mg | ORAL_TABLET | Freq: Once | ORAL | Status: AC
  Administered 2021-03-10: 60 mg via ORAL
  Filled 2021-03-10: qty 1

## 2021-03-10 NOTE — ED Notes (Signed)
Patient verbalizes understanding of discharge instructions. Opportunity for questioning and answers were provided. Patient discharged from ED.  °

## 2021-03-10 NOTE — ED Notes (Signed)
Patient is being discharged from the Urgent Care and sent to the Emergency Department via POV . Per H. Ether Griffins, FNP, patient is in need of higher level of care due to condition of eye. Patient is aware and verbalizes understanding of plan of care.  Vitals:   03/10/21 0908  BP: (!) 160/101  Pulse: 62  Temp: 98.3 F (36.8 C)  SpO2: 98%

## 2021-03-10 NOTE — ED Triage Notes (Signed)
Patient stated that she got stung on Tuesday.  She stated that she did not see what stung her.  She was able to open her eyes Wednesday and now, her left eye is swollen.

## 2021-03-10 NOTE — ED Provider Notes (Signed)
[facility] EMERGENCY DEPT Provider Note   CSN: [id number] Arrival date & time: 03/10/21  4469     History Chief Complaint  Patient presents with   Facial Swelling    Stephanie Fitzgerald is a 50 y.o. female.  HPI 50 year old female presents with left periorbital swelling.  2 days ago she was stung by something on her forehead and she felt a sting her but does not know what it was.  The next day she had a little bit of facial swelling.  However when she woke up this morning her entire eyelids were swollen and she cannot open them.  There is no pain but is uncomfortable and itchy.  She is taken some Benadryl and applied topical treatments.  No lip or tongue swelling or trouble breathing.  Past Medical History:  Diagnosis Date   Lupus ([facility])     There are no problems to display for this patient.   Past Surgical History:  Procedure Laterality Date   TUBAL LIGATION       OB History     Gravida  3   Para  3   Term      Preterm      AB      Living         SAB      IAB      Ectopic      Multiple      Live Births              History reviewed. No pertinent family history.  Social History   Tobacco Use   Smoking status: Every Day    Packs/day: 0.50    Types: Cigarettes   Smokeless tobacco: Never  Vaping Use   Vaping Use: Never used  Substance Use Topics   Alcohol use: Not Currently    Home Medications Prior to Admission medications   Medication Sig Start Date End Date Taking? Authorizing Provider  predniSONE (DELTASONE) 20 MG tablet 2 tabs po daily x 4 days 03/11/21  Yes Pricilla Loveless, MD    Allergies    Patient has no known allergies.  Review of Systems   Review of Systems  HENT:  Positive for facial swelling.    Physical Exam Updated Vital Signs BP (!) 159/99 (BP Location: Right Arm)   Pulse (!) 59   Temp 97.8 F (36.6 C) (Oral)   Resp 16   Ht 5\' 7"  (1.702 m)   Wt 111.3 kg   LMP 05/22/2019   SpO2 100%   BMI 38.43  kg/m   Physical Exam Vitals and nursing note reviewed.  Constitutional:      Appearance: She is well-developed.  HENT:     Head: Normocephalic and atraumatic.      Comments: No lip, tongue, oropharyngeal swelling    Right Ear: External ear normal.     Left Ear: External ear normal.     Nose: Nose normal.  Eyes:     General:        Right eye: No discharge.        Left eye: No discharge.     Extraocular Movements: Extraocular movements intact.     Pupils: Pupils are equal, round, and reactive to light.     Comments: I am able to separate her lids and see her eye which does not appear acutely inflamed/injected and can move in all directions.  Cardiovascular:     Rate and Rhythm: Normal rate and regular rhythm.  Heart sounds: Normal heart sounds.  Pulmonary:     Effort: Pulmonary effort is normal.     Breath sounds: Normal breath sounds. No stridor. No wheezing or rales.  Abdominal:     General: There is no distension.  Skin:    General: Skin is warm and dry.  Neurological:     Mental Status: She is alert.  Psychiatric:        Mood and Affect: Mood is not anxious.    ED Results / Procedures / Treatments   Labs (all labs ordered are listed, but only abnormal results are displayed) Labs Reviewed - No data to display  EKG None  Radiology No results found.  Procedures Procedures   Medications Ordered in ED Medications  predniSONE (DELTASONE) tablet 60 mg (has no administration in time range)    ED Course  I have reviewed the triage vital signs and the nursing notes.  Pertinent labs & imaging results that were available during my care of the patient were reviewed by me and considered in my medical decision making (see chart for details).    MDM Rules/Calculators/A&P                           Patient appears to have an exaggerated allergic reaction to the insect sting.  I am able to open her eye and can see that she can move her eye in all directions and  there is no overt injection.  Will prescribe burst of steroids in addition to continuing Benadryl.  Advised to apply warm compresses.  Follow-up with PCP. Final Clinical Impression(s) / ED Diagnoses Final diagnoses:  Periorbital swelling  Allergic reaction to insect sting, accidental or unintentional, initial encounter    Rx / DC Orders ED Discharge Orders          Ordered    predniSONE (DELTASONE) 20 MG tablet        03/10/21 1011             Pricilla Loveless, MD 03/10/21 1014

## 2021-03-10 NOTE — ED Triage Notes (Addendum)
Pts left eye is swollen, states she got stung by unknown source on Tuesday (left forehead). Pt is not able to open eye. Denies SOB. Pt states she can somewhat see out of right eye as well. Interventions: Benadryl- last night, not helpful

## 2021-03-10 NOTE — Discharge Instructions (Addendum)
If you have new or worsening swelling, throat, tongue, or lip swelling, trouble breathing or swallowing, or any other new/concerning symptoms then return to the ER for evaluation.  Start the steroid tomorrow as you were given your first dose today.  Continue to take Benadryl every 4-6 hours.  Apply warm compresses.

## 2021-03-10 NOTE — ED Provider Notes (Signed)
Called to triage by nursing staff for evaluation of left eye swelling for patient.  Patient states that she got stung in the eye approximately 2 days ago by unknown source.  Denies any difficulty breathing or shortness of breath.  Eye is significantly swollen and patient is not able to open eye.  Unable to open eye on exam due to severity of swelling.  Advised patient that she will need to go to the ER for further evaluation due to significance of swelling and possible damage to eye.  Patient was agreeable with plan.   Lance Muss, FNP 03/10/21 [phone]

## 2021-08-17 ENCOUNTER — Emergency Department (HOSPITAL_COMMUNITY)
Admission: EM | Admit: 2021-08-17 | Discharge: 2021-08-17 | Disposition: A | Discharge status: Home / Self Care | Payer: Self-pay | Attending: Emergency Medicine | Admitting: Emergency Medicine

## 2021-08-17 ENCOUNTER — Emergency Department (HOSPITAL_COMMUNITY): Payer: Self-pay

## 2021-08-17 ENCOUNTER — Other Ambulatory Visit: Payer: Self-pay

## 2021-08-17 ENCOUNTER — Encounter (HOSPITAL_COMMUNITY): Payer: Self-pay | Admitting: *Deleted

## 2021-08-17 DIAGNOSIS — M25461 Effusion, right knee: Secondary | ICD-10-CM | POA: Insufficient documentation

## 2021-08-17 DIAGNOSIS — M25561 Pain in right knee: Secondary | ICD-10-CM

## 2021-08-17 NOTE — ED Provider Notes (Signed)
Stephanie Fitzgerald Arrival date & time: 08/17/21  1531     History  Chief Complaint  Patient presents with   Knee Pain    Stephanie Fitzgerald is a 51 y.o. female.   Knee Pain   Stephanie Fitzgerald , a 51 y.o. female  was evaluated in triage.  Pt complains of R knee pain. She felt a pop on Sunday while walking and noticed Monday it was swollen. States its' been achy and swollen since. She did not fall or incur any traumatic injury.    No fever chills. No hx surgeries.      Home Medications Prior to Admission medications   Medication Sig Start Date End Date Taking? Authorizing Provider  predniSONE (DELTASONE) 20 MG tablet 2 tabs po daily x 4 days 03/11/21   Pricilla Loveless, MD      Allergies    Patient has no known allergies.    Review of Systems   Review of Systems  Musculoskeletal:  Positive for arthralgias.   Physical Exam Updated Vital Signs BP (!) 140/99 (BP Location: Right Arm)    Pulse 80    Temp 97.8 F (36.6 C) (Oral)    Resp 20    Ht 5\' 7"  (1.702 m)    LMP 05/22/2019    SpO2 97%    BMI 38.43 kg/m  Physical Exam Vitals and nursing note reviewed.  Constitutional:      General: She is not in acute distress.    Appearance: Normal appearance. She is not ill-appearing.  HENT:     Head: Normocephalic and atraumatic.  Eyes:     General: No scleral icterus.       Right eye: No discharge.        Left eye: No discharge.     Conjunctiva/sclera: Conjunctivae normal.  Pulmonary:     Effort: Pulmonary effort is normal.     Breath sounds: No stridor.  Musculoskeletal:     Comments: Positive ballottement of right knee. No significant tenderness to palpation of the right knee. Range of motion somewhat decreased at the right knee.  Able to flex and extend approximately 120 degrees without difficulty but has some discomfort with extreme flexion.  Sensation intact in all 4 extremities.  Neurological:     Mental  Status: She is alert and oriented to person, place, and time. Mental status is at baseline.    ED Results / Procedures / Treatments   Labs (all labs ordered are listed, but only abnormal results are displayed) Labs Reviewed - No data to display  EKG None  Radiology DG Knee Complete 4 Views Right  Result Date: 08/17/2021 CLINICAL DATA:  Knee pain EXAM: RIGHT KNEE - COMPLETE 4+ VIEW COMPARISON:  None. FINDINGS: No fracture or malalignment. Small moderate knee effusion. The joint spaces appear patent IMPRESSION: Knee effusion.  No acute osseous abnormality Electronically Signed   By: 10/15/2021 M.D.   On: 08/17/2021 16:04    Procedures Procedures    Medications Ordered in ED Medications - No data to display  ED Course/ Medical Decision Making/ A&P                           Medical Decision Making  Patient is 51 year old female with no pertinent past medical history that she endorses.  She is presented emergency room today with complaints of right knee pain.  She states that approximately 4  days ago she was walking along when it made a popping sound and she has had some pain in that right knee since she notes that it swelled up some since then.  She states she is still able to walk but she feels a bit unstable she denies any numbness or weakness in her lower extremities she states that she did not fall or have any trauma to the knee.  Physical exam is notable for no significant tenderness of the right knee but does have small joint effusion.  No redness or cellulitic changes to the knee.  Able to passively and actively range the knee doubt septic arthritis due to this.  Distal pulses intact  X-ray confirms effusion of the knee joint but no fractures.  I do have some concern for meniscal injury relatively low suspicion for ACL PCL rupture given no joint laxity on exam  She will need to follow-up with orthopedics.  Given knee immobilizer and crutches.  Given information for [facility] and return precautions were given to her.  She will need to stay in knee immobilizer when walking with crutches until orthopedics instruct otherwise.  Patient agreeable to plan.  I independently reviewed x-rays from this visit.  Blood pressure somewhat elevated she will need to follow-up with PCP to follow-up on this.  Final Clinical Impression(s) / ED Diagnoses Final diagnoses:  Acute pain of right knee    Rx / DC Orders ED Discharge Orders     None         Gailen Shelter, [place] 08/17/21 1818    Cheryll Cockayne, MD 08/17/21 2110

## 2021-08-17 NOTE — ED Notes (Signed)
Dc instructions reviewed with pt no questions or concerns at this time. Pt ambulated with crutches to lobby. Pt understands to follow up with [facility] for follow up

## 2021-08-17 NOTE — Discharge Instructions (Signed)
Your x-ray did not show any bony abnormalities but does show a small amount of fluid in your knee joint that is called an effusion.  This, and your story is concerning for a ligament injury.  You will need to wear the knee immobilizer until you you are instructed to do otherwise by an orthopedist.  I have given you the information for an orthopedic group here in [place].  Ice, wear the knee immobilizer and use crutches.  Please use Tylenol or ibuprofen for pain.  You may use 600 mg ibuprofen every 6 hours or 1000 mg of Tylenol every 6 hours.  You may choose to alternate between the 2.  This would be most effective.  Not to exceed 4 g of Tylenol within 24 hours.  Not to exceed 3200 mg ibuprofen 24 hours.

## 2021-08-17 NOTE — ED Triage Notes (Signed)
Pt felt a pop in her rt knee Sunday, has developed swelling and pain since.

## 2021-08-17 NOTE — ED Provider Triage Note (Signed)
Emergency Medicine Provider Triage Evaluation Note  Stephanie Fitzgerald , a 51 y.o. female  was evaluated in triage.  Pt complains of R knee pain. She felt a pop on Sunday while walking and noticed Monday it was swollen. States its' been achy and swollen since. She did not fall or incur any traumatic injury.   No fever chills. No hx surgeries.   Review of Systems  Positive: R knee pain Negative: Fever   Physical Exam  BP (!) 148/104 (BP Location: Left Arm)    Pulse 78    Temp 97.8 F (36.6 C) (Oral)    Resp 19    Ht 5\' 7"  (1.702 m)    LMP 05/22/2019    SpO2 96%    BMI 38.43 kg/m  Gen:   Awake, no distress Resp:  Normal effort  MSK:   Moves extremities without difficulty  Other:  Mild swelling of right knee. Able to flex and extend.   Medical Decision Making  Medically screening exam initiated at 3:45 PM.  Appropriate orders placed.  MALEEHA HALLS was informed that the remainder of the evaluation will be completed by another provider, this initial triage assessment does not replace that evaluation, and the importance of remaining in the ED until their evaluation is complete.  Derek Mound, Rhina Brackett 08/17/21 1547

## 2021-12-16 IMAGING — US US PELVIS COMPLETE TRANSABD/TRANSVAG W DUPLEX
1 series · 13 of 25 positions shown · non-contrast
Comparison: CT 06/29/2010

CLINICAL DATA: Low back pain radiating to pelvis.



[Series 1: us pelvic complete w transvaginal and torsion righ · 13 of 67 slices shown]
[im 1/67]
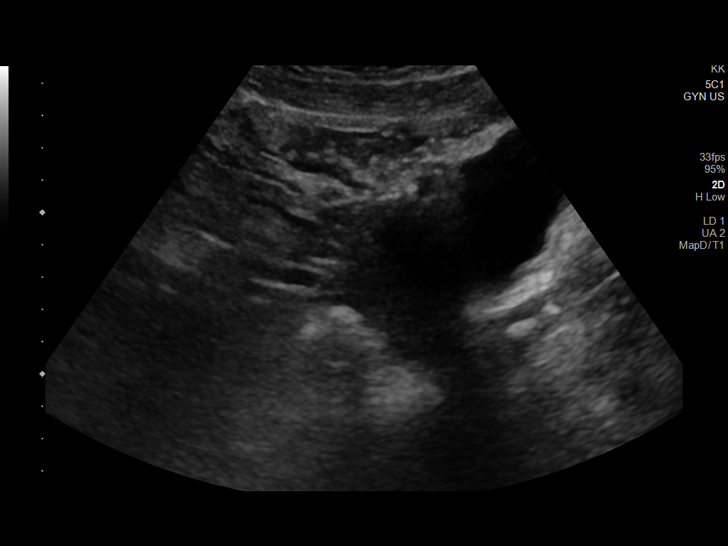
[im 6/67]
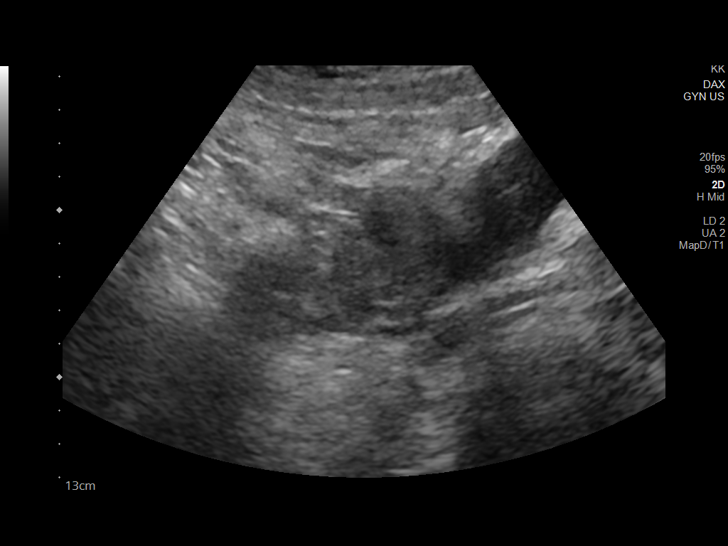
[im 12/67]
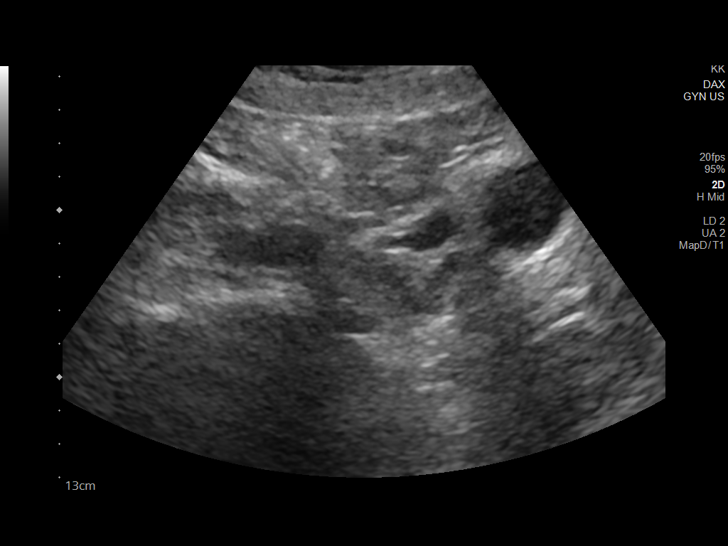
[im 17/67]
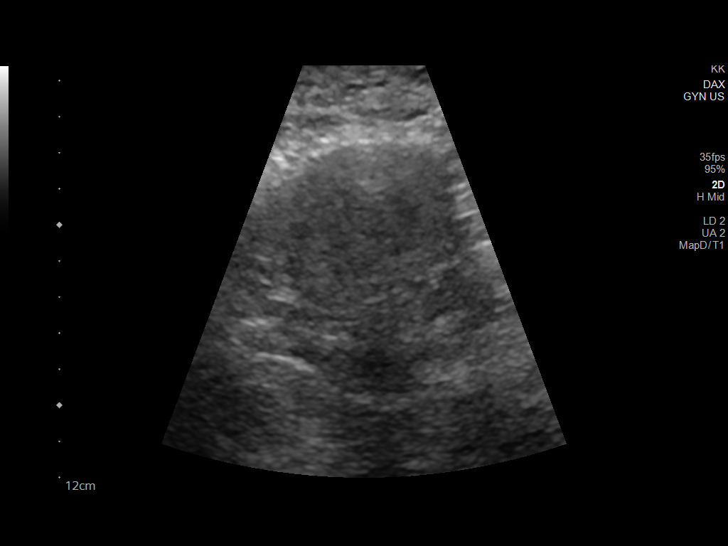
[im 23/67]
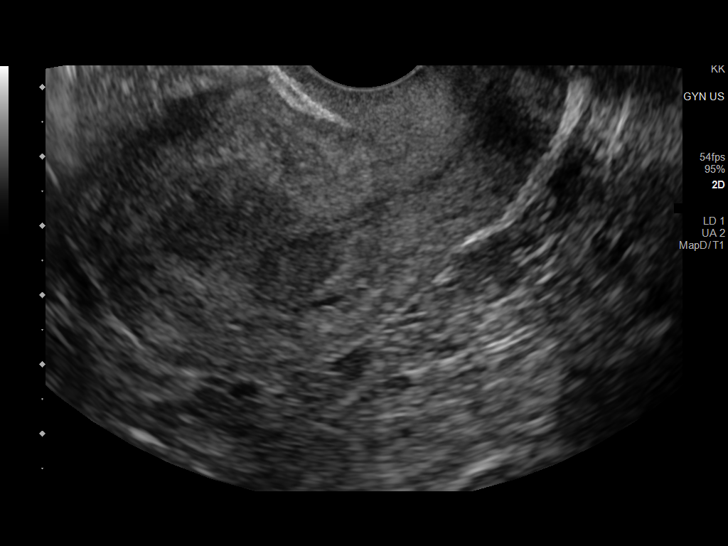
[im 28/67]
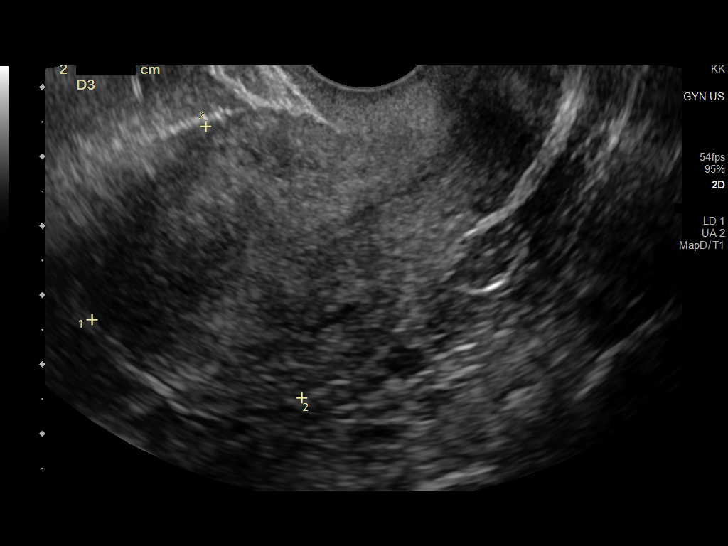
[im 34/67]
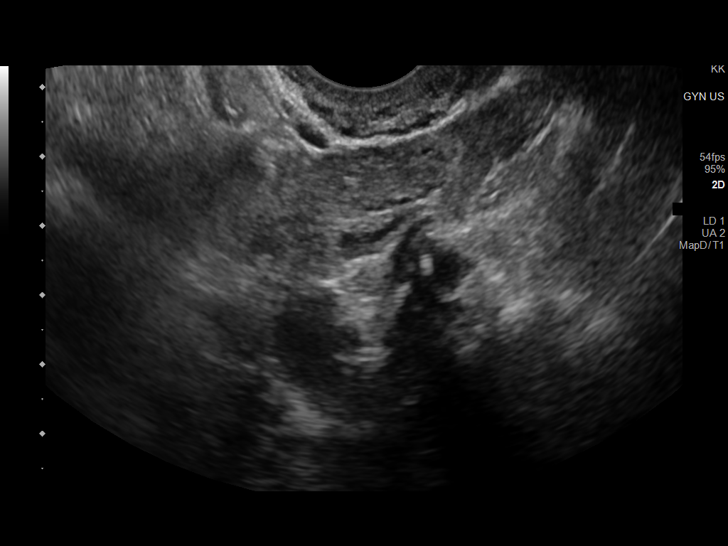
[im 39/67]
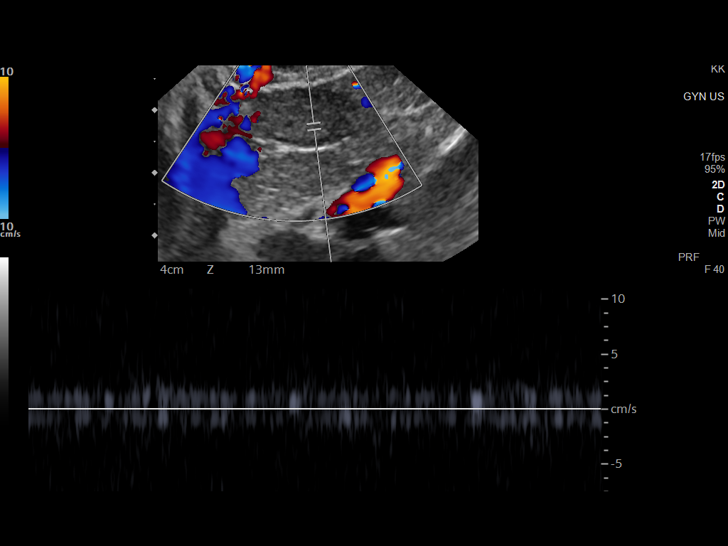
[im 45/67]
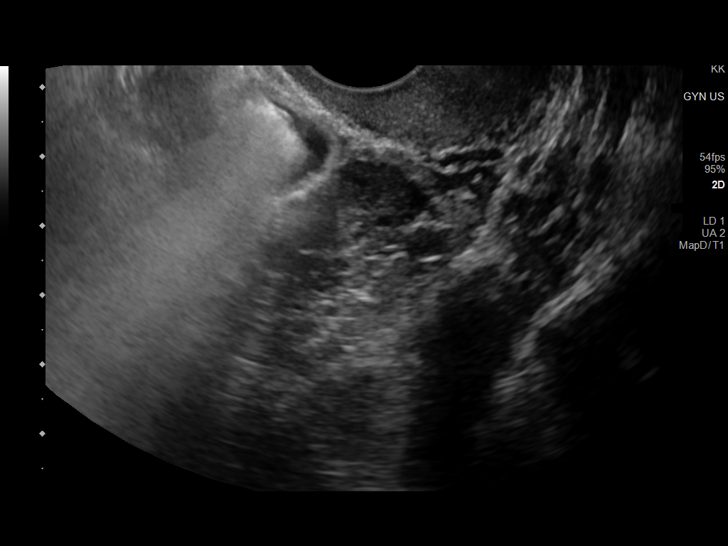
[im 50/67]
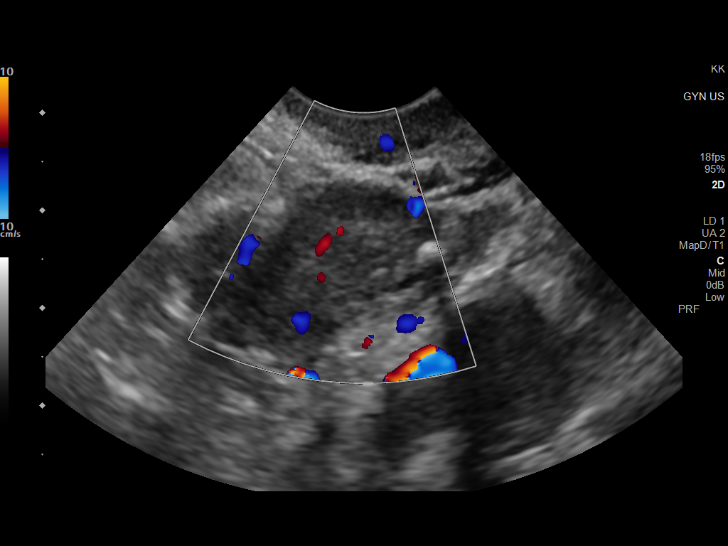
[im 56/67]
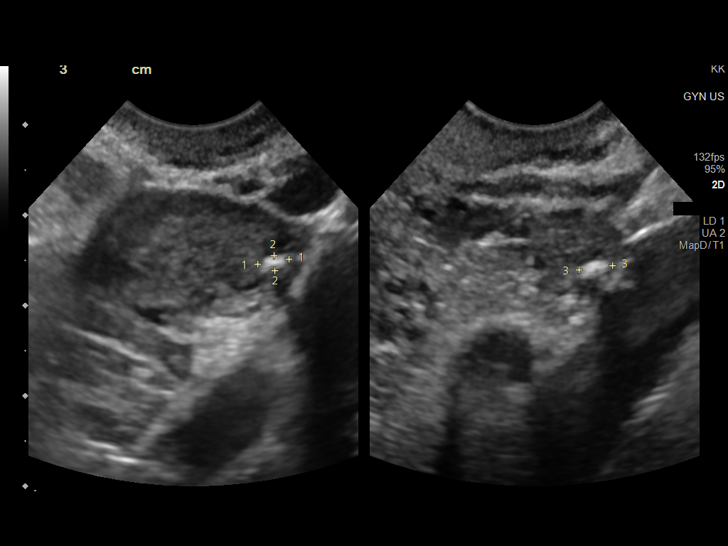
[im 61/67]
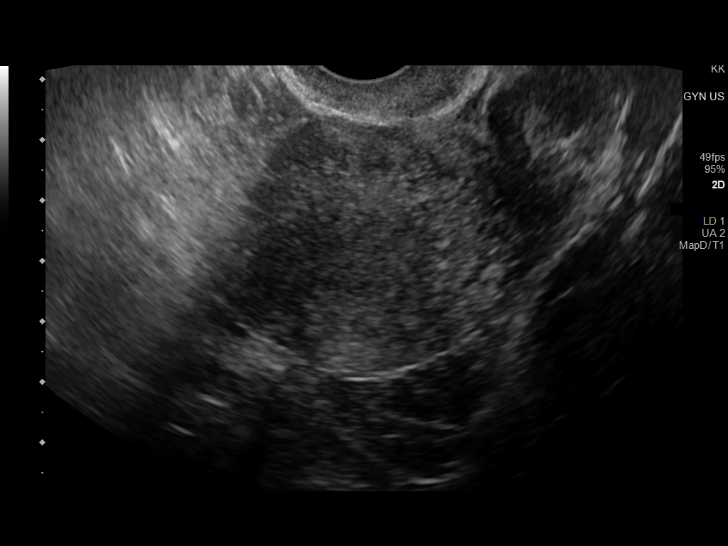
[im 67/67]
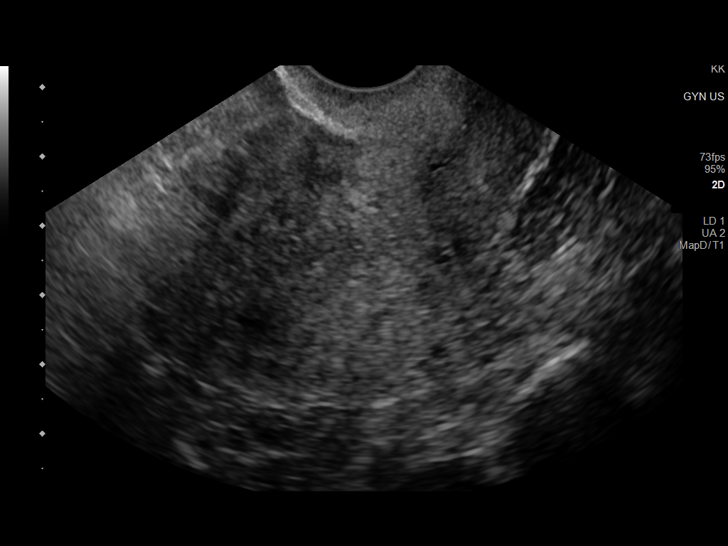

[13 of 25 positions shown; findings below may reference images not displayed]

FINDINGS: Uterus

Measurements: 7.9 x 4.2 x 5.2 cm = volume: 90 mL. Small hypodensity
identified within the LEFT aspect of the lower uterine segment
measuring 1.2 cm

Endometrium

Thickness: Normal at 4 mm.  No focal abnormality visualized.

Right ovary

Measurements: 2.0 x 1.0 x 2.2 cm = volume: 2.1 mL. Homogeneous
echotexture.

Left ovary

Measurements: 2.7 x 1.4 x 2.0 cm = volume: 3.9 mL. Small 2 mm
echogenic focus could represent a small calcification. Otherwise
normal LEFT ovary.

Other findings

No abnormal free fluid.
IMPRESSION: 1. Essentially normal uterus and ovaries.
2. Probable small leiomyoma in the lower uterine segment.
3. Normal color Doppler flow to the LEFT and RIGHT ovary.

## 2022-05-08 ENCOUNTER — Ambulatory Visit (HOSPITAL_COMMUNITY)
Admission: EM | Admit: 2022-05-08 | Discharge: 2022-05-08 | Disposition: A | Discharge status: Home / Self Care | Payer: 59 | Attending: Family Medicine | Admitting: Family Medicine

## 2022-05-08 ENCOUNTER — Encounter (HOSPITAL_COMMUNITY): Payer: Self-pay | Admitting: Emergency Medicine

## 2022-05-08 DIAGNOSIS — W57XXXA Bitten or stung by nonvenomous insect and other nonvenomous arthropods, initial encounter: Secondary | ICD-10-CM | POA: Diagnosis not present

## 2022-05-08 DIAGNOSIS — L03115 Cellulitis of right lower limb: Secondary | ICD-10-CM | POA: Diagnosis not present

## 2022-05-08 DIAGNOSIS — S90861A Insect bite (nonvenomous), right foot, initial encounter: Secondary | ICD-10-CM | POA: Diagnosis not present

## 2022-05-08 MED ORDER — CEFTRIAXONE SODIUM 1 G IJ SOLR
1.0000 g | Freq: Once | INTRAMUSCULAR | Status: AC
  Administered 2022-05-08: 1 g via INTRAMUSCULAR

## 2022-05-08 MED ORDER — CEFTRIAXONE SODIUM 1 G IJ SOLR
INTRAMUSCULAR | Status: AC
  Filled 2022-05-08: qty 10

## 2022-05-08 MED ORDER — LIDOCAINE HCL (PF) 1 % IJ SOLN
INTRAMUSCULAR | Status: AC
  Filled 2022-05-08: qty 2

## 2022-05-08 MED ORDER — AMOXICILLIN-POT CLAVULANATE 875-125 MG PO TABS: 1.0000 | ORAL_TABLET | Freq: Two times a day (BID) | ORAL | 0 refills | Status: AC

## 2022-05-08 NOTE — Discharge Instructions (Addendum)
You were seen today for cellulitis of the foot due to insect bite.  I have given you a shot of rocephin today, and sent out an antibiotic to your pharmacy.  Please stay off of your feet, keeping your foot elevated.  Use ice for pain and swelling.  Please return if the swelling is not improving, or you have worsening redness and swelling up the leg.

## 2022-05-08 NOTE — ED Triage Notes (Signed)
Pt reports bit by something Saturday on right foot, thinks red ant. Has pain and swelling to right foot. C/o numbness in 4th and 5th toes.

## 2022-05-08 NOTE — ED Provider Notes (Signed)
[facility]    CSN: [id number] Arrival date & time: 05/08/22  0940      History   Chief Complaint Chief Complaint  Patient presents with   Insect Bite   Foot Pain    HPI Stephanie Fitzgerald is a 51 y.o. female.   Patient is here for bite of the right foot, and now with pain and swelling.  Was [place] the park over the [place] and thinks a red ant bit her right foot.  She went to work on Sunday, and noted the foot was a bit painful and swollen.  This morning she woke up and the foot was more swollen, painful, with numbness in the toes No fevers/chills.        Past Medical History:  Diagnosis Date   Lupus (HCC)     There are no problems to display for this patient.   Past Surgical History:  Procedure Laterality Date   TUBAL LIGATION      OB History     Gravida  3   Para  3   Term      Preterm      AB      Living         SAB      IAB      Ectopic      Multiple      Live Births               Home Medications    Prior to Admission medications   Not on File    Family History No family history on file.  Social History Social History   Tobacco Use   Smoking status: Every Day    Packs/day: 0.50    Types: Cigarettes   Smokeless tobacco: Never  Vaping Use   Vaping Use: Never used  Substance Use Topics   Alcohol use: Not Currently   Drug use: Yes    Types: Marijuana     Allergies   Patient has no known allergies.   Review of Systems Review of Systems  Constitutional: Negative.   HENT: Negative.    Respiratory: Negative.    Cardiovascular: Negative.   Gastrointestinal: Negative.   Genitourinary: Negative.   Musculoskeletal:  Positive for joint swelling.  Skin:  Positive for color change.     Physical Exam Triage Vital Signs ED Triage Vitals  Enc Vitals Group     BP 05/08/22 1017 100/77     Pulse Rate 05/08/22 1017 69     Resp 05/08/22 1017 18     Temp 05/08/22 1017 98.1 F (36.7 C)     Temp Source  05/08/22 1017 Oral     SpO2 05/08/22 1017 100 %     Weight --      Height --      Head Circumference --      Peak Flow --      Pain Score 05/08/22 1015 6     Pain Loc --      Pain Edu? --      Excl. in GC? --    No data found.  Updated Vital Signs BP 100/77 (BP Location: Left Arm)   Pulse 69   Temp 98.1 F (36.7 C) (Oral)   Resp 18   LMP 05/22/2019   SpO2 100%   Visual Acuity Right Eye Distance:   Left Eye Distance:   Bilateral Distance:    Right Eye Near:   Left Eye Near:  Bilateral Near:     Physical Exam Constitutional:      Appearance: Normal appearance.  Skin:    General: Skin is warm.     Comments: The top of the right foot and lateral malleolus is swollen and warm; + erythema at the top of the foot laterally and into the right ankle;  decreased ROM    Neurological:     General: No focal deficit present.     Mental Status: She is alert.  Psychiatric:        Mood and Affect: Mood normal.      UC Treatments / Results  Labs (all labs ordered are listed, but only abnormal results are displayed) Labs Reviewed - No data to display  EKG   Radiology No results found.  Procedures Procedures (including critical care time)  Medications Ordered in UC Medications  cefTRIAXone (ROCEPHIN) injection 1 g (has no administration in time range)    Initial Impression / Assessment and Plan / UC Course  I have reviewed the triage vital signs and the nursing notes.  Pertinent labs & imaging results that were available during my care of the patient were reviewed by me and considered in my medical decision making (see chart for details).   Final Clinical Impressions(s) / UC Diagnoses   Final diagnoses:  Cellulitis of right lower extremity  Insect bite of right foot, initial encounter     Discharge Instructions      You were seen today for cellulitis of the foot due to insect bite.  I have given you a shot of rocephin today, and sent out an antibiotic  to your pharmacy.  Please stay off of your feet, keeping your foot elevated.  Use ice for pain and swelling.  Please return if the swelling is not improving, or you have worsening redness and swelling up the leg.     ED Prescriptions     Medication Sig Dispense Auth. Provider   amoxicillin-clavulanate (AUGMENTIN) 875-125 MG tablet Take 1 tablet by mouth every 12 (twelve) hours. 14 tablet Rondel Oh, MD      PDMP not reviewed this encounter.   Rondel Oh, MD 05/08/22 1038
